# Patient Record
Sex: Male | Born: 1950 | Race: Black or African American | Hispanic: No | Marital: Married | Smoking: Current every day smoker
Health system: Southern US, Community
[De-identification: ages and names within clinical notes are randomized; demographics above are authoritative.]

## PROBLEM LIST (undated history)

## (undated) DIAGNOSIS — I429 Cardiomyopathy, unspecified: Secondary | ICD-10-CM

## (undated) DIAGNOSIS — I2510 Atherosclerotic heart disease of native coronary artery without angina pectoris: Secondary | ICD-10-CM

## (undated) HISTORY — DX: Atherosclerotic heart disease of native coronary artery without angina pectoris: I25.10

## (undated) HISTORY — DX: Cardiomyopathy, unspecified: I42.9

---

## 1999-08-14 ENCOUNTER — Emergency Department (HOSPITAL_COMMUNITY): Admission: EM | Admit: 1999-08-14 | Discharge: 1999-08-14 | Payer: Self-pay | Admitting: Emergency Medicine

## 2004-06-10 ENCOUNTER — Emergency Department (HOSPITAL_COMMUNITY): Admission: EM | Admit: 2004-06-10 | Discharge: 2004-06-10 | Payer: Self-pay | Admitting: Emergency Medicine

## 2004-06-12 ENCOUNTER — Ambulatory Visit: Payer: Self-pay | Admitting: Internal Medicine

## 2004-09-21 ENCOUNTER — Emergency Department (HOSPITAL_COMMUNITY): Admission: EM | Admit: 2004-09-21 | Discharge: 2004-09-21 | Payer: Self-pay | Admitting: *Deleted

## 2006-02-23 ENCOUNTER — Emergency Department (HOSPITAL_COMMUNITY): Admission: EM | Admit: 2006-02-23 | Discharge: 2006-02-23 | Payer: Self-pay | Admitting: Emergency Medicine

## 2006-04-07 ENCOUNTER — Emergency Department (HOSPITAL_COMMUNITY): Admission: EM | Admit: 2006-04-07 | Discharge: 2006-04-07 | Payer: Self-pay | Admitting: Emergency Medicine

## 2006-05-15 ENCOUNTER — Emergency Department (HOSPITAL_COMMUNITY): Admission: EM | Admit: 2006-05-15 | Discharge: 2006-05-15 | Payer: Self-pay | Admitting: Emergency Medicine

## 2006-05-18 ENCOUNTER — Emergency Department (HOSPITAL_COMMUNITY): Admission: EM | Admit: 2006-05-18 | Discharge: 2006-05-18 | Payer: Self-pay | Admitting: Family Medicine

## 2006-05-23 ENCOUNTER — Emergency Department (HOSPITAL_COMMUNITY): Admission: EM | Admit: 2006-05-23 | Discharge: 2006-05-23 | Payer: Self-pay | Admitting: Emergency Medicine

## 2010-06-21 ENCOUNTER — Inpatient Hospital Stay (INDEPENDENT_AMBULATORY_CARE_PROVIDER_SITE_OTHER)
Admission: RE | Admit: 2010-06-21 | Discharge: 2010-06-21 | Disposition: A | Discharge status: Home / Self Care | Source: Ambulatory Visit | Attending: Emergency Medicine | Admitting: Emergency Medicine

## 2010-06-21 DIAGNOSIS — L03119 Cellulitis of unspecified part of limb: Secondary | ICD-10-CM

## 2010-06-25 LAB — CULTURE, ROUTINE-ABSCESS: Gram Stain: NONE SEEN

## 2011-01-13 ENCOUNTER — Emergency Department (HOSPITAL_COMMUNITY): Payer: BC Managed Care – PPO

## 2011-01-13 ENCOUNTER — Observation Stay (HOSPITAL_COMMUNITY)
Admission: EM | Admit: 2011-01-13 | Discharge: 2011-01-14 | Disposition: A | Discharge status: Home / Self Care | Payer: BC Managed Care – PPO | Attending: Internal Medicine | Admitting: Internal Medicine

## 2011-01-13 DIAGNOSIS — R030 Elevated blood-pressure reading, without diagnosis of hypertension: Secondary | ICD-10-CM | POA: Insufficient documentation

## 2011-01-13 DIAGNOSIS — I428 Other cardiomyopathies: Principal | ICD-10-CM | POA: Insufficient documentation

## 2011-01-13 DIAGNOSIS — J438 Other emphysema: Secondary | ICD-10-CM | POA: Insufficient documentation

## 2011-01-13 DIAGNOSIS — F17200 Nicotine dependence, unspecified, uncomplicated: Secondary | ICD-10-CM | POA: Insufficient documentation

## 2011-01-13 DIAGNOSIS — R0989 Other specified symptoms and signs involving the circulatory and respiratory systems: Secondary | ICD-10-CM | POA: Insufficient documentation

## 2011-01-13 DIAGNOSIS — R9431 Abnormal electrocardiogram [ECG] [EKG]: Secondary | ICD-10-CM | POA: Insufficient documentation

## 2011-01-13 DIAGNOSIS — R0602 Shortness of breath: Secondary | ICD-10-CM | POA: Insufficient documentation

## 2011-01-13 DIAGNOSIS — R0609 Other forms of dyspnea: Secondary | ICD-10-CM | POA: Insufficient documentation

## 2011-01-13 DIAGNOSIS — J310 Chronic rhinitis: Secondary | ICD-10-CM | POA: Insufficient documentation

## 2011-01-14 ENCOUNTER — Emergency Department (HOSPITAL_COMMUNITY): Payer: BC Managed Care – PPO

## 2011-01-14 ENCOUNTER — Observation Stay (HOSPITAL_COMMUNITY): Payer: BC Managed Care – PPO

## 2011-01-14 ENCOUNTER — Observation Stay (HOSPITAL_COMMUNITY)
Admission: EM | Admit: 2011-01-14 | Discharge: 2011-01-16 | Disposition: A | Discharge status: Home / Self Care | Payer: BC Managed Care – PPO | Attending: Interventional Cardiology | Admitting: Interventional Cardiology

## 2011-01-14 DIAGNOSIS — Z01810 Encounter for preprocedural cardiovascular examination: Secondary | ICD-10-CM | POA: Insufficient documentation

## 2011-01-14 DIAGNOSIS — R0602 Shortness of breath: Secondary | ICD-10-CM | POA: Insufficient documentation

## 2011-01-14 DIAGNOSIS — I2510 Atherosclerotic heart disease of native coronary artery without angina pectoris: Principal | ICD-10-CM | POA: Insufficient documentation

## 2011-01-14 DIAGNOSIS — R0989 Other specified symptoms and signs involving the circulatory and respiratory systems: Secondary | ICD-10-CM | POA: Insufficient documentation

## 2011-01-14 DIAGNOSIS — I472 Ventricular tachycardia, unspecified: Secondary | ICD-10-CM | POA: Insufficient documentation

## 2011-01-14 DIAGNOSIS — R0609 Other forms of dyspnea: Secondary | ICD-10-CM | POA: Insufficient documentation

## 2011-01-14 DIAGNOSIS — I4729 Other ventricular tachycardia: Secondary | ICD-10-CM | POA: Insufficient documentation

## 2011-01-14 DIAGNOSIS — I428 Other cardiomyopathies: Secondary | ICD-10-CM | POA: Insufficient documentation

## 2011-01-14 DIAGNOSIS — I200 Unstable angina: Secondary | ICD-10-CM | POA: Insufficient documentation

## 2011-01-14 DIAGNOSIS — I359 Nonrheumatic aortic valve disorder, unspecified: Secondary | ICD-10-CM

## 2011-01-14 DIAGNOSIS — F17200 Nicotine dependence, unspecified, uncomplicated: Secondary | ICD-10-CM | POA: Insufficient documentation

## 2011-01-14 DIAGNOSIS — Z01812 Encounter for preprocedural laboratory examination: Secondary | ICD-10-CM | POA: Insufficient documentation

## 2011-01-14 DIAGNOSIS — R079 Chest pain, unspecified: Secondary | ICD-10-CM

## 2011-01-14 LAB — CBC
HCT: 42.8 % (ref 39.0–52.0)
HCT: 43.6 % (ref 39.0–52.0)
Hemoglobin: 15 g/dL (ref 13.0–17.0)
MCH: 33.6 pg (ref 26.0–34.0)
MCHC: 34.9 g/dL (ref 30.0–36.0)
MCHC: 35 g/dL (ref 30.0–36.0)
MCV: 95.7 fL (ref 78.0–100.0)
MCV: 97.5 fL (ref 78.0–100.0)
Platelets: 155 10*3/uL (ref 150–400)
RDW: 12.7 % (ref 11.5–15.5)
WBC: 5.9 10*3/uL (ref 4.0–10.5)

## 2011-01-14 LAB — POCT I-STAT, CHEM 8
Calcium, Ion: 1.24 mmol/L (ref 1.12–1.32)
Chloride: 105 mEq/L (ref 96–112)
Potassium: 3.6 mEq/L (ref 3.5–5.1)

## 2011-01-14 LAB — CARDIAC PANEL(CRET KIN+CKTOT+MB+TROPI)
CK, MB: 2.6 ng/mL (ref 0.3–4.0)
Relative Index: 1.8 (ref 0.0–2.5)
Relative Index: 2 (ref 0.0–2.5)
Total CK: 133 U/L (ref 7–232)
Troponin I: <0.3 ng/mL (ref <0.30)

## 2011-01-14 LAB — DIFFERENTIAL
Basophils Absolute: 0 10*3/uL (ref 0.0–0.1)
Eosinophils Relative: 2 % (ref 0–5)
Eosinophils Relative: 3 % (ref 0–5)
Lymphs Abs: 1.9 10*3/uL (ref 0.7–4.0)
Monocytes Absolute: 0.4 10*3/uL (ref 0.1–1.0)
Monocytes Absolute: 0.5 10*3/uL (ref 0.1–1.0)
Monocytes Relative: 8 % (ref 3–12)
Neutrophils Relative %: 58 % (ref 43–77)

## 2011-01-14 LAB — COMPREHENSIVE METABOLIC PANEL
AST: 20 U/L (ref 0–37)
Albumin: 3.9 g/dL (ref 3.5–5.2)
Alkaline Phosphatase: 84 U/L (ref 39–117)
BUN: 13 mg/dL (ref 6–23)
Calcium: 9.8 mg/dL (ref 8.4–10.5)
Chloride: 105 mEq/L (ref 96–112)
Creatinine, Ser: 0.93 mg/dL (ref 0.50–1.35)
Glucose, Bld: 104 mg/dL — ABNORMAL HIGH (ref 70–99)
Sodium: 142 mEq/L (ref 135–145)
Total Bilirubin: 0.5 mg/dL (ref 0.3–1.2)
Total Protein: 7.1 g/dL (ref 6.0–8.3)

## 2011-01-14 LAB — LIPID PANEL
LDL Cholesterol: 97 mg/dL (ref 0–99)
Total CHOL/HDL Ratio: 2.2 RATIO
Triglycerides: 63 mg/dL (ref <150)

## 2011-01-14 LAB — CK TOTAL AND CKMB (NOT AT ARMC): Total CK: 179 U/L (ref 7–232)
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
## 2011-01-15 LAB — BASIC METABOLIC PANEL
BUN: 15 mg/dL (ref 6–23)
Chloride: 106 mEq/L (ref 96–112)
GFR calc Af Amer: >90 mL/min (ref >90)
Glucose, Bld: 95 mg/dL (ref 70–99)
Potassium: 4 mEq/L (ref 3.5–5.1)

## 2011-01-15 LAB — TROPONIN I: Troponin I: <0.3 ng/mL (ref <0.30)

## 2011-01-15 LAB — POCT I-STAT TROPONIN I

## 2011-01-15 LAB — PROTIME-INR: Prothrombin Time: 13.3 seconds (ref 11.6–15.2)

## 2011-01-15 LAB — CK TOTAL AND CKMB (NOT AT ARMC): CK, MB: 2.4 ng/mL (ref 0.3–4.0)

## 2011-01-15 LAB — CARDIAC PANEL(CRET KIN+CKTOT+MB+TROPI)
CK, MB: 2.4 ng/mL (ref 0.3–4.0)
Relative Index: 2.1 (ref 0.0–2.5)
Total CK: 115 U/L (ref 7–232)
Troponin I: <0.3 ng/mL (ref <0.30)
Troponin I: <0.3 ng/mL (ref <0.30)

## 2011-01-15 LAB — APTT: aPTT: 32 seconds (ref 24–37)

## 2011-01-16 LAB — BASIC METABOLIC PANEL
CO2: 25 mEq/L (ref 19–32)
Calcium: 9.1 mg/dL (ref 8.4–10.5)
GFR calc Af Amer: >90 mL/min (ref >90)
GFR calc non Af Amer: >90 mL/min (ref >90)
Sodium: 139 mEq/L (ref 135–145)

## 2011-01-16 LAB — CBC
MCH: 33.2 pg (ref 26.0–34.0)
Platelets: 146 10*3/uL — ABNORMAL LOW (ref 150–400)
RBC: 4.1 MIL/uL — ABNORMAL LOW (ref 4.22–5.81)
RDW: 12.6 % (ref 11.5–15.5)

## 2011-01-28 NOTE — H&P (Signed)
Steven Bernard, [name]:  [id number]  MEDICAL RECORD NO.:  [id number]  LOCATION:                                 FACILITY:  PHYSICIAN:  Harlon Flor, MD   DATE OF BIRTH:  Aug 23, 1950  DATE OF ADMISSION:  01/14/2011 DATE OF DISCHARGE:                             HISTORY & PHYSICAL   CARDIOLOGIST:  Corky Crafts, MD  PRIMARY CARE:  There is none.  CHIEF COMPLAINT:  Chest pain.  HISTORY OF PRESENT ILLNESS:  Steven Bernard is a 60 year old gentleman who was admitted yesterday to the Hospitalist Service for shortness of breath, which was felt to be due to sinus congestion.  He was noted to have anterior Q-waves on his EKG and underwent an echocardiogram that showed an EF of 45%-50% with apical and anterolateral akinesis.  It was suspected that he had had a silent MI and because he had not had any chest pain, he was discharged home after being seen by Dr. [name] with plans for an outpatient left heart catheterization.  After lying in bed tonight, he developed 20 minutes of chest discomfort that spontaneously resolved.  This occurred at rest.  He has also had continued episodes of exertional shortness of breath and shortness of breath at rest.  Because of this, he came back to the ER tonight.  PAST MEDICAL HISTORY: 1. Suspected previous acute MI, silent. 2. Cardiomyopathy with mild LV dysfunction, recently started on an ACE     inhibitor.  PAST SURGICAL HISTORY:  None.  ALLERGIES:  No known drug allergies.  MEDICATIONS: 1. Aspirin 81 mg daily. 2. Lisinopril 10 mg daily. 3. Flonase 2 sprays daily. 4. Albuterol p.r.n.  SOCIAL HISTORY:  The patient smokes 4-6 cigarettes per day, but planning to quit.  He does not drink currently, and he works in maintenance.  FAMILY HISTORY:  His father had an MI in his 37s, and his brother had lung disease.  REVIEW OF SYSTEMS:  A 14-point review of systems is negative except as stated in the HPI.  PHYSICAL  EXAMINATION:  VITAL SIGNS:  Blood pressure 128/80, pulse 55, respirations 16, temperature 97.6. GENERAL:  No acute distress. HEENT:  Extraocular movements intact.  Oropharynx benign.  Nonicteric sclera. NECK:  Supple. CARDIOVASCULAR:  Regular rate and rhythm without murmurs, rubs, or gallops. LUNGS:  Clear to auscultation bilaterally. ABDOMEN:  Soft, nontender, and nondistended. EXTREMITIES:  There is no clubbing, cyanosis, or edema.  His bilateral femoral pulses are 2+. NEURO:  Grossly afocal.  Moves all extremities well.  He is alert and oriented. SKIN:  No rashes. LYMPH:  No lymphadenopathy.  EKG was reviewed and shows normal sinus rhythm with anterior Q-waves. Chest x-ray from yesterday was reviewed and this was clear without any acute abnormality.  His point-of-care troponin is normal.  His BMP and CBC are reviewed and are normal.  His echocardiogram from yesterday was reviewed and shows a 45%-50% ejection fraction with apical and anterolateral akinesis.  ASSESSMENT AND PLAN: 1. Steven Bernard is a 60 year old gentleman with suspected silent myocardial     infarction in the past who had plans for an outpatient left heart  catheterization who then developed chest pain at rest tonight.  He     is currently chest pain-free, and his enzymes are negative.  We     will increase his aspirin to 325 and follow his cardiac enzymes.     We will not put him on heparin or Lovenox for now unless his     enzymes are abnormal.  We will plan a left heart cath in the     morning to define his anatomy.  If he has recurrent chest pain, we     will consider adding Imdur. 2. Cardiomyopathy:  Mild LV dysfunction, on an ACE inhibitor.  His     heart rate is in the 50s, so we will hold off on starting a beta-     blocker for now. 3. Shortness of breath:  Could be related to his cardiomyopathy, but     appears to be more likely due to postnasal drip.     Harlon Flor, MD     MMB/MEDQ  D:   01/15/2011  T:  01/15/2011  Job:  [id number]  cc:   Corky Crafts, MD  Electronically Signed by Meridee Score MD on 01/28/2011 11:51:26 PM

## 2011-02-09 NOTE — Discharge Summary (Signed)
Steven Bernard, CATTERTON NO.:  [id number]  MEDICAL RECORD NO.:  [id number]  LOCATION:  2502                         FACILITY:  [facility]  PHYSICIAN:  Corky Crafts, MDDATE OF BIRTH:  10/01/1950  DATE OF ADMISSION:  01/15/2011 DATE OF DISCHARGE:  01/16/2011                              DISCHARGE SUMMARY   FINAL DIAGNOSES: 1. Coronary artery disease. 2. Tobacco abuse. 3. Unstable angina.  PROCEDURES PERFORMED:  Cardiac catheterization with bare metal stent placement to the obtuse marginal.  There is attempted angioplasty of the LAD which did not result in significant improvement due to heavy calcification.  HOSPITAL COURSE:  The patient had been in the hospital day earlier.  An echocardiogram revealed abnormal LV function in a pattern that did look like there is an ischemic cause.  We had initially set him up for outpatient catheterization since he was essentially asymptomatic, however, after reaching hospital on the 3rd, he developed some discomfort in his chest.  He was admitted.  He ruled out for MI.  He underwent cardiac catheterization revealing a chronically occluded LAD with right-to-left collaterals.  There is also a focal 90% OM stenosis. He had 1 short run of nonsustained ventricular tachycardia.  He had no chest discomfort while he was in the hospital.  We attempted angioplasty of the LAD, however, it was heavily calcified and it was difficult to advance and remove balloon even after dilatation with a small balloon. Flow was essentially unchanged.  He had collaterals from both the left and the right system.  We subsequently put a bare metal stent into the OM.  He tolerated this well.  I had a long discussion with his family regarding the plan of care. Given his minimal symptoms, he really wanted to avoid bypass surgery if possible.  The other options for his coronary disease were medical therapy.  He does have collateral flow to his distal LAD.   He is essentially asymptomatic.  His LV function is only mildly reduced.  The other option if he develops symptoms was to bring him back and attempt rotational atherectomy followed by stent placement in the LAD.  I think this would be quite a complicated procedure because of the long area of heavy calcification.  It is unclear how big the vessel is as well.  My preference was to attempt medical therapy at this point.  I will follow up with him in the office and we will see how he does.  DISCHARGE MEDICATIONS: 1. Aspirin 325 mg daily. 2. Sublingual nitroglycerin p.r.n. 3. Tylenol p.r.n. 4. Plavix 75 mg daily. 5. Imdur 30 mg daily. 6. Flonase nasal spray. 7. Albuterol inhaler. 8. Lisinopril 10 mg daily. 9. Simvastatin 20 mg daily.  Of note, we did not send him home with a beta-blocker due to bradycardia.  Follow up appointments with Dr. [name] on October 23 at 9:30 a.m.  He will also establish with Dr. Juleen China.  DIET:  Low-sodium, heart-healthy diet.  ACTIVITY:  Increase activity slowly.  No lifting more than 10 pounds for about a week.     Corky Crafts, MD     JSV/MEDQ  D:  01/19/2011  T:  01/19/2011  Job:  [id number]  Electronically Signed by Lance Muss MD on 02/09/2011 01:20:20 PM
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
## 2013-01-08 ENCOUNTER — Encounter: Payer: Self-pay | Admitting: Interventional Cardiology

## 2013-01-11 ENCOUNTER — Encounter: Payer: Self-pay | Admitting: Interventional Cardiology

## 2013-01-11 ENCOUNTER — Encounter: Payer: Self-pay | Admitting: *Deleted

## 2013-01-11 DIAGNOSIS — I429 Cardiomyopathy, unspecified: Secondary | ICD-10-CM | POA: Insufficient documentation

## 2013-01-11 DIAGNOSIS — I2510 Atherosclerotic heart disease of native coronary artery without angina pectoris: Secondary | ICD-10-CM | POA: Insufficient documentation

## 2013-01-17 ENCOUNTER — Ambulatory Visit (INDEPENDENT_AMBULATORY_CARE_PROVIDER_SITE_OTHER): Payer: BC Managed Care – PPO | Admitting: Interventional Cardiology

## 2013-01-17 ENCOUNTER — Encounter: Payer: Self-pay | Admitting: Interventional Cardiology

## 2013-01-17 VITALS — BP 128/84 | HR 62 | Ht 67.0 in | Wt 139.2 lb

## 2013-01-17 DIAGNOSIS — F17200 Nicotine dependence, unspecified, uncomplicated: Secondary | ICD-10-CM

## 2013-01-17 DIAGNOSIS — E782 Mixed hyperlipidemia: Secondary | ICD-10-CM

## 2013-01-17 DIAGNOSIS — I428 Other cardiomyopathies: Secondary | ICD-10-CM

## 2013-01-17 DIAGNOSIS — I429 Cardiomyopathy, unspecified: Secondary | ICD-10-CM

## 2013-01-17 DIAGNOSIS — I2510 Atherosclerotic heart disease of native coronary artery without angina pectoris: Secondary | ICD-10-CM

## 2013-01-17 MED ORDER — ASPIRIN EC 81 MG PO TBEC: 81.0000 mg | DELAYED_RELEASE_TABLET | Freq: Every day | ORAL | Status: AC

## 2013-01-17 NOTE — Patient Instructions (Addendum)
Your physician wants you to follow-up in: 6 months with Dr. [name]. You will receive a reminder letter in the mail two months in advance. If you don't receive a letter, please call our office to schedule the follow-up appointment.  Decrease Aspirin to 81mg  daily.  Your physician recommends that you return for lab work in: today pt has been fasting for over 6 hrs. Okay with with Dr. [name].

## 2013-01-17 NOTE — Progress Notes (Signed)
Patient ID: Steven Bernard, male   DOB: 1950/12/30, 62 y.o.   MRN: [id number]    [address] [place], [place]  [address] Phone: [phone] Fax:  [phone]  Date:  01/17/2013   ID:  Steven Bernard, DOB May 18, 1950, MRN [id number]  PCP:  No primary provider on file.      History of Present Illness: Steven Bernard is a 62 y.o. male who has multivessel CAD. He had a stent to the OM system. His LAD is occluded. CAD/ASCVD:  Denies : Chest pain.  Diaphoresis.  Dizziness.  Dyspnea on exertion.  Fatigue.  Leg edema.  Nitroglycerin.  Orthopnea.  Palpitations.  Paroxysmal nocturnal dyspnea.  Syncope.     Wt Readings from Last 3 Encounters:  01/17/13 139 lb 3.2 oz (63.141 kg)     Past Medical History  Diagnosis Date  . Coronary atherosclerosis of native coronary artery   . Cardiomyopathy     Current Outpatient Prescriptions  Medication Sig Dispense Refill  . aspirin 325 MG tablet Take 325 mg by mouth daily.      . carvedilol (COREG) 3.125 MG tablet Take 3.125 mg by mouth 2 (two) times daily with a meal.      . clopidogrel (PLAVIX) 75 MG tablet Take 75 mg by mouth daily.      . isosorbide mononitrate (IMDUR) 30 MG 24 hr tablet Take 30 mg by mouth daily.      Marland Kitchen lisinopril (PRINIVIL,ZESTRIL) 10 MG tablet Take 10 mg by mouth daily.      . nitroGLYCERIN (NITROSTAT) 0.4 MG SL tablet Place 0.4 mg under the tongue every 5 (five) minutes as needed for chest pain.      . simvastatin (ZOCOR) 40 MG tablet Take 40 mg by mouth every evening.       No current facility-administered medications for this visit.    Allergies:   No Known Allergies  Social History:  The patient  reports that he has quit smoking. He does not have any smokeless tobacco history on file. He reports that he does not drink alcohol or use illicit drugs.   Family History:  The patient's family history is not on file.   ROS:  Please see the history of present illness.  No nausea, vomiting.  No fevers,  chills.  No focal weakness.  No dysuria.  All other systems reviewed and negative.   PHYSICAL EXAM: VS:  BP 128/84  Pulse 62  Ht 5\' 7"  (1.702 m)  Wt 139 lb 3.2 oz (63.141 kg)  BMI 21.8 kg/m2 Well nourished, well developed, in no acute distress HEENT: normal Neck: no JVD, no carotid bruits Cardiac:  normal S1, S2; RRR;  Lungs:  clear to auscultation bilaterally, no wheezing, rhonchi or rales Abd: soft, nontender, no hepatomegaly Ext: no edema Skin: warm and dry Neuro:   no focal abnormalities noted       ASSESSMENT AND PLAN:  Coronary atherosclerosis of native coronary artery  Continue Aspirin Tablet, 325 MG, 1 tablet, Orally, Once a day Continue Plavix Tablet, 75 MG, 1 tablet, Orally, Once a day Increase Simvastatin Tablet, 40 MG, 1 tablet every evening, Orally, Once a day IMAGING: EKG   Harward,Amy 07/11/2012 04:00:54 PM > Mykell Genao,JAY 07/11/2012 04:21:20 PM > NSR, NSST, septal MI   Notes: No angina. Maintaining exercise tolerance despite LAD disease. LDL 101. Complicated LAD occlusion with significant calcification. We discussed intervention with Rotablator but given his lack of symptoms, we both agree that it  is not indicated at this time. If he develops heart failure or significant angina, we could reconsider repeat cardiac cath.  2. Tobacco dependence  Notes: Restarted smoking. He only smokes a few per weeks but needs to quit.  3. Cardiomyopathy  Continue Carvedilol Tablet, 6.25 MG, 1 tablet with food, Orally, Twice a day, 30 day(s), 60, Refills 11 IMAGING: [facility] Echocardiogram (Ordered for 07/11/2012) Notes: beta blocker to help improve LV function. If BP increases, could consider increasing carvedilol. He will call in to tell us his BP readings. Will recheck echo.  4.  HYPERLIPIDEMIA: Check lipids today. Continue simvastatin for now.   Signed, Fredric Mare, MD, [facility] 01/17/2013 3:50 PM

## 2013-01-18 DIAGNOSIS — E782 Mixed hyperlipidemia: Secondary | ICD-10-CM | POA: Insufficient documentation

## 2013-01-18 DIAGNOSIS — Z87891 Personal history of nicotine dependence: Secondary | ICD-10-CM | POA: Insufficient documentation

## 2013-01-18 LAB — LIPID PANEL
Cholesterol: 221 mg/dL — ABNORMAL HIGH (ref 0–200)
Total CHOL/HDL Ratio: 2
Triglycerides: 59 mg/dL (ref 0.0–149.0)

## 2013-01-20 ENCOUNTER — Telehealth: Payer: Self-pay | Admitting: Interventional Cardiology

## 2013-01-20 DIAGNOSIS — Z79899 Other long term (current) drug therapy: Secondary | ICD-10-CM

## 2013-01-20 DIAGNOSIS — E785 Hyperlipidemia, unspecified: Secondary | ICD-10-CM

## 2013-01-20 MED ORDER — ATORVASTATIN CALCIUM 80 MG PO TABS: 80.0000 mg | ORAL_TABLET | Freq: Every day | ORAL | Status: DC

## 2013-01-20 NOTE — Telephone Encounter (Signed)
RF:  CABG, tobacco use, age - LDL goal < 70, non-HDL goal < 100 Meds:  Simvastatin 40 mg qd. LDL stable ~ 100 mg/dL on simvastatin 40 mg qd.  Tolerating this well. Given his elevated LDL, will change to atorvastatin 80 mg qd, and recheck cholesterol / liver in 3 months. Patient notified to d/c simvastatin, start atorvastatin 80 mg qd, and recheck lipid/hepatic panel on 05/06/12.  Rx sent in, Med list updated, and lab set up.

## 2013-01-20 NOTE — Telephone Encounter (Signed)
New Problem  Pt calling for cholesterol results.

## 2013-04-26 ENCOUNTER — Other Ambulatory Visit: Payer: BC Managed Care – PPO

## 2013-05-10 ENCOUNTER — Other Ambulatory Visit (INDEPENDENT_AMBULATORY_CARE_PROVIDER_SITE_OTHER): Payer: BC Managed Care – PPO

## 2013-05-10 DIAGNOSIS — Z79899 Other long term (current) drug therapy: Secondary | ICD-10-CM

## 2013-05-10 DIAGNOSIS — E785 Hyperlipidemia, unspecified: Secondary | ICD-10-CM

## 2013-05-10 LAB — LIPID PANEL
Cholesterol: 172 mg/dL (ref 0–200)
HDL: 81 mg/dL (ref >39.00)
LDL Cholesterol: 81 mg/dL (ref 0–99)
Total CHOL/HDL Ratio: 2
Triglycerides: 52 mg/dL (ref 0.0–149.0)
VLDL: 10.4 mg/dL (ref 0.0–40.0)

## 2013-05-10 LAB — HEPATIC FUNCTION PANEL
ALK PHOS: 51 U/L (ref 39–117)
ALT: 15 U/L (ref 0–53)
AST: 19 U/L (ref 0–37)
Albumin: 3.8 g/dL (ref 3.5–5.2)
BILIRUBIN DIRECT: 0 mg/dL (ref 0.0–0.3)
BILIRUBIN TOTAL: 0.6 mg/dL (ref 0.3–1.2)
Total Protein: 6.7 g/dL (ref 6.0–8.3)

## 2013-05-10 NOTE — Progress Notes (Signed)
Quick Note:  Preliminary report reviewed by triage nurse and sent to MD desk. ______ 

## 2013-05-16 ENCOUNTER — Encounter: Payer: Self-pay | Admitting: Cardiology

## 2013-05-16 ENCOUNTER — Telehealth: Payer: Self-pay | Admitting: Cardiology

## 2013-05-16 DIAGNOSIS — E782 Mixed hyperlipidemia: Secondary | ICD-10-CM

## 2013-05-16 NOTE — Telephone Encounter (Signed)
Message copied by Theda SersSTEGALL, Aslan Himes H on Tue May 16, 2013 12:01 PM ------      Message from: Corky CraftsVARANASI, JAYADEEP S      Created: Sat May 13, 2013 11:33 PM       Lipids controled.  Continue current meds, ------

## 2013-05-16 NOTE — Telephone Encounter (Signed)
1 year follow up lab scheduled and ordered.

## 2013-05-24 ENCOUNTER — Telehealth: Payer: Self-pay | Admitting: Interventional Cardiology

## 2013-05-24 NOTE — Telephone Encounter (Signed)
To Dr. [name], please advise.  

## 2013-05-24 NOTE — Telephone Encounter (Signed)
New message    Want the name of a primary care doctor Dr Seth BakeV recommends

## 2013-05-29 NOTE — Telephone Encounter (Signed)
Pt.notified

## 2013-05-29 NOTE — Telephone Encounter (Signed)
New Problem:  Pt is calling to hear lab results.

## 2013-05-31 NOTE — Telephone Encounter (Signed)
Please find out which [facility] office would be closest to him and we can choose from there.  I would like for him to go to someone on Epic.

## 2013-06-01 NOTE — Telephone Encounter (Signed)
FYi to Dr. [name]. 

## 2013-06-01 NOTE — Telephone Encounter (Signed)
lmtrc

## 2013-06-01 NOTE — Telephone Encounter (Signed)
Spoke with pt and he already has an appt [facility]

## 2013-06-19 ENCOUNTER — Ambulatory Visit (INDEPENDENT_AMBULATORY_CARE_PROVIDER_SITE_OTHER): Payer: BC Managed Care – PPO | Admitting: Physician Assistant

## 2013-06-19 ENCOUNTER — Encounter: Payer: Self-pay | Admitting: Physician Assistant

## 2013-06-19 VITALS — BP 120/78 | HR 72 | Temp 97.8°F | Resp 16 | Wt 139.1 lb

## 2013-06-19 DIAGNOSIS — Z131 Encounter for screening for diabetes mellitus: Secondary | ICD-10-CM

## 2013-06-19 DIAGNOSIS — Z1211 Encounter for screening for malignant neoplasm of colon: Secondary | ICD-10-CM

## 2013-06-19 DIAGNOSIS — I2510 Atherosclerotic heart disease of native coronary artery without angina pectoris: Secondary | ICD-10-CM

## 2013-06-19 DIAGNOSIS — Z0000 Encounter for general adult medical examination without abnormal findings: Secondary | ICD-10-CM

## 2013-06-19 DIAGNOSIS — E785 Hyperlipidemia, unspecified: Secondary | ICD-10-CM

## 2013-06-19 DIAGNOSIS — E782 Mixed hyperlipidemia: Secondary | ICD-10-CM

## 2013-06-19 DIAGNOSIS — I10 Essential (primary) hypertension: Secondary | ICD-10-CM | POA: Insufficient documentation

## 2013-06-19 LAB — GLUCOSE, POCT (MANUAL RESULT ENTRY): POC Glucose: 109 mg/dl — AB (ref 70–99)

## 2013-06-19 NOTE — Assessment & Plan Note (Signed)
Well controlled, last labs  Lab Results  Component Value Date   CHOL 172 05/10/2013   HDL 81.00 05/10/2013   LDLCALC 81 05/10/2013   LDLDIRECT 103.1 01/17/2013   TRIG 52.0 05/10/2013   CHOLHDL 2 05/10/2013    Atorvastatin 80 mg once daily

## 2013-06-19 NOTE — Progress Notes (Signed)
Patient ID: Steven Bernard is a 63 y.o. male DOB: May 06, 2050 MRN: [id number]     HPI:  Patient is a 63 year old male who presents to the office to establish care. Is retired from [employer], currently works in a maintenance position. Patient reports history of Coronary Atherosclerosis, follows with cardiologist Dr. Eldridge DaceVaranasi, had stent placement in 2012. Treated with daily ASA 81 mg, Plavix 75 mg daily. History of HTN treated with Coreg 3.125 mg twice daily and Lisinopril 10 mg daily, Hyperlipidemia treated with Lipitor 80 mg daily. Angina treated with Imdur 30 mg daily and Nitroglycerin 0.4 mg as needed. Reports has never had to use the nitroglycerin since he has had it. Denies chest pain/palpitations, SOB, cough, extremity swelling, N/V/F, change in bowel/bladder habits, blood in urine or stool, nocturia, visual change/disturbances, numbness, tingling or weakness. Does not require refills today.   Influenza: does not take Tetanus: uncertain Eye Dr. Has appointment scheduled today Dentist: appointment in May 2015, for dentures Colonoscopy: never  ROS: As stated in HPI. All other systems negative  Past Medical History  Diagnosis Date  . Coronary atherosclerosis of native coronary artery   . Cardiomyopathy    Family History  Problem Relation Age of Onset  . Breast cancer Mother   . Cancer Mother   . Heart attack Father   . Stroke Father    History   Social History  . Marital Status: Single    Spouse Name: N/A    Number of Children: N/A  . Years of Education: N/A   Social History Main Topics  . Smoking status: Former Smoker -- 1.00 packs/day for 45 years  . Smokeless tobacco: None  . Alcohol Use: Yes  . Drug Use: No  . Sexual Activity: None   Other Topics Concern  . None   Social History Narrative  . None   History reviewed. No pertinent past surgical history. Current Outpatient Prescriptions on File Prior to Visit  Medication Sig Dispense Refill  . aspirin EC 81 MG  tablet Take 1 tablet (81 mg total) by mouth daily.  90 tablet  3  . atorvastatin (LIPITOR) 80 MG tablet Take 1 tablet (80 mg total) by mouth daily.  30 tablet  6  . carvedilol (COREG) 3.125 MG tablet Take 3.125 mg by mouth 2 (two) times daily with a meal.      . clopidogrel (PLAVIX) 75 MG tablet Take 75 mg by mouth daily.      . isosorbide mononitrate (IMDUR) 30 MG 24 hr tablet Take 30 mg by mouth daily.      Marland Kitchen. lisinopril (PRINIVIL,ZESTRIL) 10 MG tablet Take 10 mg by mouth daily.      . nitroGLYCERIN (NITROSTAT) 0.4 MG SL tablet Place 0.4 mg under the tongue every 5 (five) minutes as needed for chest pain.       No current facility-administered medications on file prior to visit.   No Known Allergies  PE:  Filed Vitals:   06/19/13 0835  BP: 120/78  Pulse: 72  Temp: 97.8 F (36.6 C)  Resp: 16    CONSTITUTIONAL: Well developed, well nourished, pleasant, appears stated age, in NAD HEENT: normocephalic, atraumatic, bilateral ext/int canals normal. Bilateral TM's without injections, bulging, erythema. Nose normal, uvula midline, oropharynx clear and moist. EYES: PERRLA, bilateral EOM and conjunctiva normal NECK: FROM, supple, without thyromegaly or mass, no carotid bruits noted CARDIO: RRR, normal S1 and S2, distal pulses intact. PULM/CHEST CTA bilateral, no wheezes, rales or rhonchi. Non tender.  ABD: appearance normal, soft, nontender. Normal bowel sounds x 4 quadrants, nonpalpable liver, spleen, kidney. GU: deferred.  MUSC: FROM U/LE bilateral, FROM thoracic and lumbar spine, no midline tenderness LYMPH: no cervical, supraclavicular adenopathy NEURO: alert and oriented x 3, no cranial nerve deficit, motor strength and coordination NL. DTR'S intact. Negative romberg. Gait normal. SKIN: warm, dry, no rash or lesions noted. PSYCH: Mood and affect normal, speech normal.   Lab Results  Component Value Date   WBC 5.3 01/16/2011   HGB 13.6 01/16/2011   HCT 39.4 01/16/2011   PLT 146*  01/16/2011   GLUCOSE 94 01/16/2011   CHOL 172 05/10/2013   TRIG 52.0 05/10/2013   HDL 81.00 05/10/2013   LDLDIRECT 103.1 01/17/2013   LDLCALC 81 05/10/2013   ALT 15 05/10/2013   AST 19 05/10/2013   NA 139 01/16/2011   K 4.2 01/16/2011   CL 107 01/16/2011   CREATININE 0.80 01/16/2011   BUN 17 01/16/2011   CO2 25 01/16/2011   INR 0.99 01/15/2011   HGBA1C 5.5 01/14/2011     ASSESSMENT and PLAN   CPX/v70.0 - Patient has been counseled on age-appropriate routine health concerns for screening and prevention. These are reviewed and up-to-date. Immunizations are up-to-date or declined. Labs and ECG reviewed.  HM: Referral for colonoscopy  HTN Continue on current medications Carvedilol 3.125 mg bid with meals Lisinopril 10 mg once daily  Coronary atherosclerosis Plavix 75 mg once daily Keep regularly scheduled appointments with cardiology as recommended  Hyperlipidemia: Atorvastatin 80 mg once daily  Angina: Isosorbide Mononitrate 30 mg once daily Nitroglycerin 0.4 mg under tongue as needed for chest pain

## 2013-06-19 NOTE — Patient Instructions (Signed)
It was great meeting you today Steven Bernard!   Labs have been ordered for you today.   Please continue taking all medications as prescribed.     Hypertension Hypertension is another name for high blood pressure. High blood pressure may mean that your heart needs to work harder to pump blood. Blood pressure consists of two numbers, which includes a higher number over a lower number (example: 110/72). HOME CARE   Make lifestyle changes as told by your doctor. This may include weight loss and exercise.  Take your blood pressure medicine every day.  Limit how much salt you use.  Stop smoking if you smoke.  Do not use drugs.  Talk to your doctor if you are using decongestants or birth control pills. These medicines might make blood pressure higher.  Females should not drink more than 1 alcoholic drink per day. Males should not drink more than 2 alcoholic drinks per day.  See your doctor as told. GET HELP RIGHT AWAY IF:   You have a blood pressure reading with a top number of 180 or higher.  You get a very bad headache.  You get blurred or changing vision.  You feel confused.  You feel weak, numb, or faint.  You get chest or belly (abdominal) pain.  You throw up (vomit).  You cannot breathe very well. MAKE SURE YOU:   Understand these instructions.  Will watch your condition.  Will get help right away if you are not doing well or get worse. Document Released: 09/16/2007 Document Revised: 06/22/2011 Document Reviewed: 09/16/2007 [facility] Patient Information 2014 [place], [place].   Health Maintenance, Males A healthy lifestyle and preventative care can promote health and wellness.  Maintain regular health, dental, and eye exams.  Eat a healthy diet. Foods like vegetables, fruits, whole grains, low-fat dairy products, and lean protein foods contain the nutrients you need and are low in calories. Decrease your intake of foods high in solid fats, added sugars, and salt.  Get information about a proper diet from your health care provider, if necessary.  Regular physical exercise is one of the most important things you can do for your health. Most adults should get at least 150 minutes of moderate-intensity exercise (any activity that increases your heart rate and causes you to sweat) each week. In addition, most adults need muscle-strengthening exercises on 2 or more days a week.   Maintain a healthy weight. The body mass index (BMI) is a screening tool to identify possible weight problems. It provides an estimate of body fat based on height and weight. Your health care provider can find your BMI and can help you achieve or maintain a healthy weight. For males 20 years and older:  A BMI below 18.5 is considered underweight.  A BMI of 18.5 to 24.9 is normal.  A BMI of 25 to 29.9 is considered overweight.  A BMI of 30 and above is considered obese.  Maintain normal blood lipids and cholesterol by exercising and minimizing your intake of saturated fat. Eat a balanced diet with plenty of fruits and vegetables. Blood tests for lipids and cholesterol should begin at age 35 and be repeated every 5 years. If your lipid or cholesterol levels are high, you are over 50, or you are at high risk for heart disease, you may need your cholesterol levels checked more frequently.Ongoing high lipid and cholesterol levels should be treated with medicines, if diet and exercise are not working.  If you smoke, find out from your  health care provider how to quit. If you do not use tobacco, do not start.  Lung cancer screening is recommended for adults aged 64 80 years who are at high risk for developing lung cancer because of a history of smoking. A yearly low-dose CT scan of the lungs is recommended for people who have at least a 30-pack-year history of smoking and are a current smoker or have quit within the past 15 years. A pack year of smoking is smoking an average of 1 pack of  cigarettes a day for 1 year (for example, a 30-pack-year history of smoking could mean smoking 1 pack a day for 30 years or 2 packs a day for 15 years). Yearly screening should continue until the smoker has stopped smoking for at least 15 years. Yearly screening should be stopped for people who develop a health problem that would prevent them from having lung cancer treatment.  If you choose to drink alcohol, do not have more than 2 drinks per day. One drink is considered to be 12 oz (360 mL) of beer, 5 oz (150 mL) of wine, or 1.5 oz (45 mL) of liquor.  Avoid use of street drugs. Do not share needles with anyone. Ask for help if you need support or instructions about stopping the use of drugs.  High blood pressure causes heart disease and increases the risk of stroke. Blood pressure should be checked at least every 1 2 years. Ongoing high blood pressure should be treated with medicines if weight loss and exercise are not effective.  If you are 5345 64 years old, ask your health care provider if you should take aspirin to prevent heart disease.  Diabetes screening involves taking a blood sample to check your fasting blood sugar level. This should be done once every 3 years after age 64, if you are at a normal weight and without risk factors for diabetes. Testing should be considered at a younger age or be carried out more frequently if you are overweight and have at least 1 risk factor for diabetes.  Colorectal cancer can be detected and often prevented. Most routine colorectal cancer screening begins at the age of 64 and continues through age 64. However, your health care provider may recommend screening at an earlier age if you have risk factors for colon cancer. On a yearly basis, your health care provider may provide home test kits to check for hidden blood in the stool. A small camera at the end of a tube may be used to directly examine the colon (sigmoidoscopy or colonoscopy) to detect the earliest  forms of colorectal cancer. Talk to your health care provider about this at age 64, when routine screening begins. A direct exam of the colon should be repeated every 5 10 years through age 64, unless early forms of pre-cancerous polyps or small growths are found.  People who are at an increased risk for hepatitis B should be screened for this virus. You are considered at high risk for hepatitis B if:  You were born in a country where hepatitis B occurs often. Talk with your health care provider about which countries are considered high-risk.  Your parents were born in a high-risk country and you have not received a shot to protect against hepatitis B (hepatitis B vaccine).  You have HIV or AIDS.  You use needles to inject street drugs.  You live with, or have sex with, someone who has hepatitis B.  You are a man who has  sex with other men (MSM).  You get hemodialysis treatment.  You take certain medicines for conditions like cancer, organ transplantation, and autoimmune conditions.  Hepatitis C blood testing is recommended for all people born from [birth year] through 1966 and any individual with known risk factors for hepatitis C.  Healthy men should no longer receive prostate-specific antigen (PSA) blood tests as part of routine cancer screening. Talk to your health care provider about prostate cancer screening.  Testicular cancer screening is not recommended for adolescents or adult males who have no symptoms. Screening includes self-exam, a health care provider exam, and other screening tests. Consult with your health care provider about any symptoms you have or any concerns you have about testicular cancer.  Practice safe sex. Use condoms and avoid high-risk sexual practices to reduce the spread of sexually transmitted infections (STIs).  Use sunscreen. Apply sunscreen liberally and repeatedly throughout the day. You should seek shade when your shadow is shorter than you. Protect yourself  by wearing long sleeves, pants, a wide-brimmed hat, and sunglasses year round, whenever you are outdoors.  Tell your health care provider of new moles or changes in moles, especially if there is a change in shape or color. Also tell your provider if a mole is larger than the size of a pencil eraser.  A one-time screening for abdominal aortic aneurysm (AAA) and surgical repair of large AAAs by ultrasound is recommended for men aged 36 75 years who are current or former smokers.  Stay current with your vaccines (immunizations). Document Released: 09/26/2007 Document Revised: 01/18/2013 Document Reviewed: 08/25/2010 [facility] Patient Information 2014 [place], [place].

## 2013-06-19 NOTE — Progress Notes (Signed)
Pre visit review using our clinic review tool, if applicable. No additional management support is needed unless otherwise documented below in the visit note. 

## 2013-06-19 NOTE — Assessment & Plan Note (Signed)
Plavix 75 mg once daily ASA 81 mg daily

## 2013-06-19 NOTE — Assessment & Plan Note (Signed)
Continue on current medications Carvedilol 3.125 mg bid with meals Lisinopril 10 mg once daily  BP Readings from Last 3 Encounters:  06/19/13 120/78  01/17/13 128/84

## 2013-06-23 ENCOUNTER — Other Ambulatory Visit (INDEPENDENT_AMBULATORY_CARE_PROVIDER_SITE_OTHER): Payer: BC Managed Care – PPO

## 2013-06-23 DIAGNOSIS — Z131 Encounter for screening for diabetes mellitus: Secondary | ICD-10-CM

## 2013-06-23 DIAGNOSIS — Z0000 Encounter for general adult medical examination without abnormal findings: Secondary | ICD-10-CM

## 2013-06-23 DIAGNOSIS — E785 Hyperlipidemia, unspecified: Secondary | ICD-10-CM

## 2013-06-23 DIAGNOSIS — I10 Essential (primary) hypertension: Secondary | ICD-10-CM

## 2013-06-23 DIAGNOSIS — I2510 Atherosclerotic heart disease of native coronary artery without angina pectoris: Secondary | ICD-10-CM

## 2013-06-23 DIAGNOSIS — Z1211 Encounter for screening for malignant neoplasm of colon: Secondary | ICD-10-CM

## 2013-06-23 LAB — CBC WITH DIFFERENTIAL/PLATELET
Basophils Absolute: 0 10*3/uL (ref 0.0–0.1)
Basophils Relative: 0.5 % (ref 0.0–3.0)
EOS ABS: 0.1 10*3/uL (ref 0.0–0.7)
EOS PCT: 1.3 % (ref 0.0–5.0)
HEMATOCRIT: 41.3 % (ref 39.0–52.0)
Hemoglobin: 14.2 g/dL (ref 13.0–17.0)
LYMPHS ABS: 1.7 10*3/uL (ref 0.7–4.0)
Lymphocytes Relative: 37.6 % (ref 12.0–46.0)
MCHC: 34.4 g/dL (ref 30.0–36.0)
MCV: 98.5 fl (ref 78.0–100.0)
MONO ABS: 0.3 10*3/uL (ref 0.1–1.0)
Monocytes Relative: 7.4 % (ref 3.0–12.0)
Neutro Abs: 2.5 10*3/uL (ref 1.4–7.7)
Neutrophils Relative %: 53.2 % (ref 43.0–77.0)
PLATELETS: 162 10*3/uL (ref 150.0–400.0)
RBC: 4.2 Mil/uL — ABNORMAL LOW (ref 4.22–5.81)
RDW: 13.3 % (ref 11.5–14.6)
WBC: 4.6 10*3/uL (ref 4.5–10.5)

## 2013-06-23 LAB — URINALYSIS, ROUTINE W REFLEX MICROSCOPIC
BILIRUBIN URINE: NEGATIVE
HGB URINE DIPSTICK: NEGATIVE
Ketones, ur: NEGATIVE
Leukocytes, UA: NEGATIVE
NITRITE: NEGATIVE
Specific Gravity, Urine: 1.025 (ref 1.000–1.030)
TOTAL PROTEIN, URINE-UPE24: NEGATIVE
Urine Glucose: NEGATIVE
Urobilinogen, UA: 0.2 (ref 0.0–1.0)
pH: 6 (ref 5.0–8.0)

## 2013-06-23 LAB — BASIC METABOLIC PANEL
BUN: 17 mg/dL (ref 6–23)
CHLORIDE: 102 meq/L (ref 96–112)
CO2: 27 mEq/L (ref 19–32)
Calcium: 9.5 mg/dL (ref 8.4–10.5)
Creatinine, Ser: 1.1 mg/dL (ref 0.4–1.5)
GFR: 90.68 mL/min (ref >60.00)
Glucose, Bld: 94 mg/dL (ref 70–99)
POTASSIUM: 4.4 meq/L (ref 3.5–5.1)
Sodium: 137 mEq/L (ref 135–145)

## 2013-06-23 LAB — TSH: TSH: 1.32 u[IU]/mL (ref 0.35–5.50)

## 2013-07-05 ENCOUNTER — Telehealth: Payer: Self-pay

## 2013-07-05 NOTE — Telephone Encounter (Signed)
Steven Bernard with [employer] called stating that pt was recently seen and had a physical form completed, however total cholesterol was missing. She is requesting a call back with that information. LMOVM providing cholesterol of 172.

## 2013-07-21 ENCOUNTER — Encounter: Payer: Self-pay | Admitting: Physician Assistant

## 2013-07-27 ENCOUNTER — Encounter: Payer: Self-pay | Admitting: Interventional Cardiology

## 2013-07-27 ENCOUNTER — Ambulatory Visit (INDEPENDENT_AMBULATORY_CARE_PROVIDER_SITE_OTHER): Payer: BC Managed Care – PPO | Admitting: Interventional Cardiology

## 2013-07-27 VITALS — BP 140/82 | HR 68 | Ht 67.0 in | Wt 141.0 lb

## 2013-07-27 DIAGNOSIS — F17200 Nicotine dependence, unspecified, uncomplicated: Secondary | ICD-10-CM

## 2013-07-27 DIAGNOSIS — I10 Essential (primary) hypertension: Secondary | ICD-10-CM

## 2013-07-27 DIAGNOSIS — Z0189 Encounter for other specified special examinations: Secondary | ICD-10-CM

## 2013-07-27 DIAGNOSIS — E782 Mixed hyperlipidemia: Secondary | ICD-10-CM

## 2013-07-27 DIAGNOSIS — I2510 Atherosclerotic heart disease of native coronary artery without angina pectoris: Secondary | ICD-10-CM

## 2013-07-27 NOTE — Patient Instructions (Addendum)
Your physician has requested that you have a cardiac MRI. Cardiac MRI uses a computer to create images of your heart as its beating, producing both still and moving pictures of your heart and major blood vessels. For further information please visit [email or web address]. Please follow the instruction sheet given to you today for more information.  Your physician recommends that you schedule a follow-up appointment after MRI.  NO CLIMBING SILOS UNTIL AFTER CARDIAC MRI.

## 2013-07-27 NOTE — Progress Notes (Signed)
Patient ID: Steven Bernard Quesinberry, male   DOB: 1950/05/17, 63 y.o.   MRN: [id number] Patient ID: Steven Bernard Bywater, male   DOB: 1950/05/17, 63 y.o.   MRN: [id number]    [address] [place], [place]  [address] Phone: [phone] Fax:  [phone]  Date:  07/27/2013   ID:  Flora LippsDavid Bernard Slee, DOB 1950/05/17, MRN [id number]  PCP:  Baltazar ApoHartman, Nancy, PA-C      History of Present Illness: Steven Bernard Danish is a 63 y.o. male who has multivessel CAD. He had a stent to the OM system. His LAD is occluded. CAD/ASCVD:  Denies : Chest pain.  Diaphoresis.  Dizziness.  Dyspnea on exertion.  Fatigue.  Leg edema.  Nitroglycerin.  Orthopnea.  Palpitations.  Paroxysmal nocturnal dyspnea.  Syncope.   He had an episode of tachycardia after climbing.  It resolved after 10 minutes.  BP outside of the doctor's office has been checked but he does not remember the readings.  Wt Readings from Last 3 Encounters:  07/27/13 141 lb (63.957 kg)  06/19/13 139 lb 1.9 oz (63.104 kg)  01/17/13 139 lb 3.2 oz (63.141 kg)     Past Medical History  Diagnosis Date  . Coronary atherosclerosis of native coronary artery   . Cardiomyopathy     Current Outpatient Prescriptions  Medication Sig Dispense Refill  . aspirin EC 81 MG tablet Take 1 tablet (81 mg total) by mouth daily.  90 tablet  3  . atorvastatin (LIPITOR) 80 MG tablet Take 1 tablet (80 mg total) by mouth daily.  30 tablet  6  . carvedilol (COREG) 3.125 MG tablet Take 3.125 mg by mouth 2 (two) times daily with a meal.      . clopidogrel (PLAVIX) 75 MG tablet Take 75 mg by mouth daily.      . isosorbide mononitrate (IMDUR) 30 MG 24 hr tablet Take 30 mg by mouth daily.      Marland Kitchen. lisinopril (PRINIVIL,ZESTRIL) 10 MG tablet Take 10 mg by mouth daily.      . nitroGLYCERIN (NITROSTAT) 0.4 MG SL tablet Place 0.4 mg under the tongue every 5 (five) minutes as needed for chest pain.       No current facility-administered medications for this visit.    Allergies:   No Known  Allergies  Social History:  The patient  reports that he has quit smoking. He does not have any smokeless tobacco history on file. He reports that he drinks alcohol. He reports that he does not use illicit drugs.   Family History:  The patient's family history includes Breast cancer in his mother; Cancer in his mother; Heart attack in his father; Stroke in his father.   ROS:  Please see the history of present illness.  No nausea, vomiting.  No fevers, chills.  No focal weakness.  No dysuria.  All other systems reviewed and negative.   PHYSICAL EXAM: VS:  BP 140/82  Pulse 68  Ht 5\' 7"  (1.702 m)  Wt 141 lb (63.957 kg)  BMI 22.08 kg/m2 Well nourished, well developed, in no acute distress HEENT: normal Neck: no JVD, no carotid bruits Cardiac:  normal S1, S2; RRR;  Lungs:  clear to auscultation bilaterally, no wheezing, rhonchi or rales Abd: soft, nontender, no hepatomegaly Ext: no edema Skin: warm and dry Neuro:   no focal abnormalities noted       ASSESSMENT AND PLAN:  Coronary atherosclerosis of native coronary artery / hyperlipidemia Decrease Aspirin Tablet, 81 MG,  1 tablet, Orally, Once a day Continue Plavix Tablet, 75 MG, 1 tablet, Orally, Once a day Increase Simvastatin Tablet, 40 MG, 1 tablet every evening, Orally, Once a day IMAGING: EKG   Harward,Amy 07/11/2012 04:00:54 PM > Seirra Kos,JAY 07/11/2012 04:21:20 PM > NSR, NSST, septal MI   Notes: No angina. Maintaining exercise tolerance despite LAD disease. LDL 81 in 1/15. Complicated LAD occlusion with significant calcification. We discussed intervention with Rotablator but given his lack of symptoms, we both agree that it is not indicated at this time. If he develops heart failure or significant angina, we could reconsider repeat cardiac cath.  He had tachycardia-did not sound like angina.  Will check MRI of heart to eval function and viability of the anterior wall and apex given CTO of LAD.  No climbing up silo at work until  cardiac eval is complete.  2. Tobacco dependence  Notes: Restarted smoking. He only smokeD a few per week but recently quit.  3. Cardiomyopathy  Continue Carvedilol Tablet, 6.25 MG, 1 tablet with food, Orally, Twice a day, 30 day(s), 60, Refills 11  Notes: beta blocker to help improve LV function. If BP increases, could consider increasing carvedilol. He will call in to tell us his BP readings.  4.  HYPERLIPIDEMIA:  lipids controlled. Continue simvastatin for now. LDL 84 in 4/13   Signed, Fredric MareJay S. Aceson Labell, MD, [facility] 07/27/2013 4:22 PM

## 2013-07-30 ENCOUNTER — Other Ambulatory Visit: Payer: Self-pay | Admitting: Interventional Cardiology

## 2013-07-31 ENCOUNTER — Other Ambulatory Visit: Payer: Self-pay

## 2013-07-31 MED ORDER — NITROGLYCERIN 0.4 MG SL SUBL: 0.4000 mg | SUBLINGUAL_TABLET | SUBLINGUAL | Status: AC | PRN

## 2013-07-31 MED ORDER — CARVEDILOL 3.125 MG PO TABS: 3.1250 mg | ORAL_TABLET | Freq: Two times a day (BID) | ORAL | Status: AC

## 2013-07-31 MED ORDER — ATORVASTATIN CALCIUM 80 MG PO TABS: 80.0000 mg | ORAL_TABLET | Freq: Every day | ORAL | Status: AC

## 2013-07-31 MED ORDER — LISINOPRIL 10 MG PO TABS: 10.0000 mg | ORAL_TABLET | Freq: Every day | ORAL | Status: AC

## 2013-07-31 MED ORDER — CLOPIDOGREL BISULFATE 75 MG PO TABS: 75.0000 mg | ORAL_TABLET | Freq: Every day | ORAL | Status: AC

## 2013-07-31 MED ORDER — ISOSORBIDE MONONITRATE ER 30 MG PO TB24: 30.0000 mg | ORAL_TABLET | Freq: Every day | ORAL | Status: AC

## 2013-08-03 ENCOUNTER — Encounter: Payer: Self-pay | Admitting: Interventional Cardiology

## 2013-08-25 ENCOUNTER — Ambulatory Visit (HOSPITAL_COMMUNITY)
Admission: RE | Admit: 2013-08-25 | Discharge: 2013-08-25 | Disposition: A | Discharge status: Home / Self Care | Payer: BC Managed Care – PPO | Source: Ambulatory Visit | Attending: Interventional Cardiology | Admitting: Interventional Cardiology

## 2013-08-25 DIAGNOSIS — I2510 Atherosclerotic heart disease of native coronary artery without angina pectoris: Secondary | ICD-10-CM

## 2013-08-25 DIAGNOSIS — Z0189 Encounter for other specified special examinations: Secondary | ICD-10-CM

## 2013-08-25 LAB — CREATININE, SERUM
CREATININE: 0.98 mg/dL (ref 0.50–1.35)
GFR calc Af Amer: >90 mL/min (ref >90)
GFR, EST NON AFRICAN AMERICAN: 86 mL/min — AB (ref >90)

## 2013-08-25 MED ORDER — GADOBENATE DIMEGLUMINE 529 MG/ML IV SOLN
20.0000 mL | Freq: Once | INTRAVENOUS | Status: AC
  Administered 2013-08-25: 20 mL via INTRAVENOUS

## 2013-09-13 ENCOUNTER — Telehealth: Payer: Self-pay | Admitting: Interventional Cardiology

## 2013-09-13 NOTE — Telephone Encounter (Signed)
He can stop aspirin and Plavix for teeth extraction.Marland Kitchen  He should continue his BP meds.    If he feels up to going up the silos, that is ok.

## 2013-09-13 NOTE — Telephone Encounter (Signed)
To Dr. [name], please advise.  

## 2013-09-13 NOTE — Telephone Encounter (Signed)
New message    Pt needs to have all of his upper teeth removed.  His dentist want him to stop all medications for 1 week prior to extractions.  Is this ok?  Is pt still restricted from going up on a silo (30-40 feet in the air)?

## 2013-09-14 NOTE — Telephone Encounter (Signed)
Pt notified. Pt will call back with fax number.

## 2013-09-21 NOTE — Telephone Encounter (Signed)
Spoke with pt and he will call back with fax number

## 2013-09-27 NOTE — Telephone Encounter (Addendum)
Spoke with pt and the fax number is  [phone] attn Dr. Chryl HeckLamber.

## 2013-09-27 NOTE — Telephone Encounter (Signed)
lmtrc

## 2013-09-27 NOTE — Telephone Encounter (Addendum)
Faxed note.

## 2013-10-20 ENCOUNTER — Encounter: Payer: Self-pay | Admitting: Internal Medicine

## 2013-10-20 ENCOUNTER — Ambulatory Visit (INDEPENDENT_AMBULATORY_CARE_PROVIDER_SITE_OTHER): Payer: BC Managed Care – PPO | Admitting: Internal Medicine

## 2013-10-20 VITALS — BP 112/72 | HR 80 | Temp 98.5°F | Ht 67.0 in | Wt 140.0 lb

## 2013-10-20 DIAGNOSIS — R21 Rash and other nonspecific skin eruption: Secondary | ICD-10-CM

## 2013-10-20 MED ORDER — KETOCONAZOLE 2 % EX CREA: 1.0000 "application " | TOPICAL_CREAM | Freq: Every day | CUTANEOUS | Status: AC

## 2013-10-20 NOTE — Progress Notes (Signed)
   Subjective:    Patient ID: Steven Bernard, male    DOB: [birth year], 63 y.o.   MRN: [id number]  HPI  Here with onset patchy light areas to bilat facies as well as left abd for several months, ? Somewhat worse, nontender , no swelling, and sweats very much most days at work.  No tx for prior similar. Pt denies chest pain, increased sob or doe, wheezing, orthopnea, PND, increased LE swelling, palpitations, dizziness or syncope. Past Medical History  Diagnosis Date  . Coronary atherosclerosis of native coronary artery   . Cardiomyopathy    No past surgical history on file.  reports that he has quit smoking. He does not have any smokeless tobacco history on file. He reports that he drinks alcohol. He reports that he does not use illicit drugs. family history includes Breast cancer in his mother; Cancer in his mother; Heart attack in his father; Stroke in his father. No Known Allergies Current Outpatient Prescriptions on File Prior to Visit  Medication Sig Dispense Refill  . aspirin EC 81 MG tablet Take 1 tablet (81 mg total) by mouth daily.  90 tablet  3  . atorvastatin (LIPITOR) 80 MG tablet Take 1 tablet (80 mg total) by mouth daily.  30 tablet  6  . carvedilol (COREG) 3.125 MG tablet Take 1 tablet (3.125 mg total) by mouth 2 (two) times daily with a meal.  60 tablet  6  . clopidogrel (PLAVIX) 75 MG tablet Take 1 tablet (75 mg total) by mouth daily.  30 tablet  6  . isosorbide mononitrate (IMDUR) 30 MG 24 hr tablet Take 1 tablet (30 mg total) by mouth daily.  30 tablet  6  . lisinopril (PRINIVIL,ZESTRIL) 10 MG tablet Take 1 tablet (10 mg total) by mouth daily.  30 tablet  6  . nitroGLYCERIN (NITROSTAT) 0.4 MG SL tablet Place 1 tablet (0.4 mg total) under the tongue every 5 (five) minutes as needed for chest pain.  25 tablet  3   No current facility-administered medications on file prior to visit.   Review of Systems All otherwise neg per pt     Objective:   Physical Exam BP 112/72   Pulse 80  Temp(Src) 98.5 F (36.9 C) (Oral)  Ht 5\' 7"  (1.702 m)  Wt 140 lb (63.504 kg)  BMI 21.92 kg/m2  SpO2 98% VS noted,  Constitutional: Pt appears well-developed, well-nourished.  HENT: Head: NCAT.  Right Ear: External ear normal.  Left Ear: External ear normal.  Eyes: . Pupils are equal, round, and reactive to light. Conjunctivae and EOM are normal Neck: Normal range of motion. Neck supple.  Cardiovascular: Normal rate and regular rhythm.   Pulmonary/Chest: Effort normal and breath sounds normal.  Neurological: Pt is alert. Not confused , motor grossly intact Skin: with patchy light areas to bilat facies, nontender, with similar less numerous to left abdomen as well Psychiatric: Pt behavior is normal. No agitation.      Assessment & Plan:

## 2013-10-20 NOTE — Progress Notes (Signed)
Pre visit review using our clinic review tool, if applicable. No additional management support is needed unless otherwise documented below in the visit note. 

## 2013-10-20 NOTE — Patient Instructions (Signed)
Please take all new medication as prescribed - the cream for the rash  Please continue all other medications as before, and refills have been done if requested.  Please have the pharmacy call with any other refills you may need.  Please keep your appointments with your specialists as you may have planned

## 2013-10-21 NOTE — Assessment & Plan Note (Signed)
C/w tinea like rash, to relatively small areas of bilat facies, and left abd - for ketoconozole cr prn,  to f/u any worsening symptoms or concerns

## 2014-01-18 ENCOUNTER — Telehealth: Payer: Self-pay | Admitting: Interventional Cardiology

## 2014-01-18 DIAGNOSIS — R002 Palpitations: Secondary | ICD-10-CM

## 2014-01-18 NOTE — Telephone Encounter (Signed)
Pt notified. Instructions given. Faxed to [employer].

## 2014-01-18 NOTE — Telephone Encounter (Signed)
New message    Patient calling did not disclose any information will explain when the nurse call,.

## 2014-01-18 NOTE — Telephone Encounter (Signed)
Pt called stating he tried to climb up the silos at work recently and his heart felt like it was going to beat out of his chest. Human Resources at pts office needs a note stating pt should not climb the silos.   Fax # [phone]

## 2014-01-18 NOTE — Telephone Encounter (Signed)
Patient should not climb stairs until further notice.  Please schedule an ETT to assess exercise capacity-not for detection of ischemia.

## 2014-01-19 ENCOUNTER — Telehealth: Payer: Self-pay | Admitting: Interventional Cardiology

## 2014-01-19 NOTE — Telephone Encounter (Signed)
New message      Patient said HR never received the form with his new restrictions listed.  Pt would like to pick up the form Monday.  If ok, you do not have to call him back

## 2014-01-19 NOTE — Telephone Encounter (Signed)
Noted. I will print last telephone note and put up front for patient to pick up.

## 2014-01-29 ENCOUNTER — Ambulatory Visit: Payer: BC Managed Care – PPO | Admitting: Internal Medicine

## 2014-02-09 ENCOUNTER — Telehealth: Payer: Self-pay | Admitting: Interventional Cardiology

## 2014-02-09 NOTE — Telephone Encounter (Signed)
Pt states that according to [facility] [employer] [facility]  the restrictions that he is on runs out on November 8 th . Pt needs to have the restrictions updated every 30 days. Pt needs updated restrictions faxed to [phone] Attn: [employer].

## 2014-02-09 NOTE — Telephone Encounter (Signed)
New problem   Pt stated the restrictions that he is on has to be updated with his job every 30 days. Please fax request to [phone] Attn: [employer]. Please call pt if any questions

## 2014-02-12 NOTE — Telephone Encounter (Signed)
Patient has a GXT scheduled with Norma FredricksonLori Gerhardt, NP on 02/13/14. See 01/18/14 phone note. I will forward the message to Lawson FiscalLori (as FYI) and Dr. Eldridge DaceVaranasi. Restrictions can be updated after his GXT on 02/13/14.

## 2014-02-13 ENCOUNTER — Ambulatory Visit (INDEPENDENT_AMBULATORY_CARE_PROVIDER_SITE_OTHER): Payer: BC Managed Care – PPO | Admitting: Nurse Practitioner

## 2014-02-13 ENCOUNTER — Encounter: Payer: Self-pay | Admitting: Nurse Practitioner

## 2014-02-13 VITALS — BP 163/98 | HR 87

## 2014-02-13 DIAGNOSIS — R002 Palpitations: Secondary | ICD-10-CM | POA: Diagnosis not present

## 2014-02-13 DIAGNOSIS — I2510 Atherosclerotic heart disease of native coronary artery without angina pectoris: Secondary | ICD-10-CM

## 2014-02-13 NOTE — Progress Notes (Signed)
Exercise Treadmill Test  Pre-Exercise Testing Evaluation Rhythm: normal sinus  Rate: 71 bpm     Test  Exercise Tolerance Test Ordering MD: Steven RankJay [name], MD  Interpreting MD: Steven FredricksonLori Gerhardt, NP  Unique Test No: 1  Treadmill:  1  Indication for ETT: palps  Contraindication to ETT: No   Stress Modality: exercise - treadmill  Cardiac Imaging Performed: non   Protocol: standard Bruce - maximal  Max BP:  192/100  Max MPHR (bpm):  157 85% MPR (bpm):  133  MPHR obtained (bpm):  137 % MPHR obtained:  87%  Reached 85% MPHR (min:sec):  3:14 Total Exercise Time (min-sec):  4:00  Workload in METS:  5.8 Borg Scale: 16  Reason ETT Terminated:  patient's desire to stop    ST Segment Analysis At Rest: normal ST segments - no evidence of significant ST depression With Exercise: no evidence of significant ST depression  Other Information Arrhythmia: One 3 beat run noted with rare PVC. Angina during ETT:  absent (0) Quality of ETT:  diagnostic  ETT Interpretation:  borderline (indeterminate) with non-specific ST changes  Comments: Patient presents today for routine GXT. Has known multivessel CAD, prior stent to the OM system. LAD is occluded. GXT to evaluate for exercise capacity NOT ischemia according to telephone note from Dr. Eldridge Bernard. Patient has work restrictions in place. Resting BP is 163/98. No chest pain. Reports still has a sensation of his "heart beating out of his chest" with going up the silo at work - his last attempt was back in September. He has an EF of 41% per MRI back in May of 2015 noted. His Coreg was held for today's study.  Today the patient exercised on the standard Bruce protocol for a total of 4 minutes.  Reduced exercise tolerance.  Adequate blood pressure response.  Clinically negative for chest pain. Test was stopped due to fatigue/patient's desire to stop and achievement of target HR.  EKG with ST depression in recovery. Patient had one 3 beat run of VT - otherwise rare  PVC noted.    Recommendations: Patient is seen subsequently with Dr. Eldridge Bernard to discuss work restrictions. His GXT is felt to be stable. No real symptoms with today's study noted.  See back as directed. Given ok to try again with walking up the silo according to Dr. Eldridge Bernard.  Patient is agreeable to this plan and will call if any problems develop in the interim.   Steven MacadamiaLori C. Gerhardt, RN, ANP-C [facility] [address] [place], [place]  [id number] [phone]

## 2014-02-13 NOTE — Telephone Encounter (Signed)
Reviewed ETT.  Patient did well without arrhythmia.  He is ok to try going up the silo at work.

## 2014-02-14 ENCOUNTER — Encounter: Payer: Self-pay | Admitting: *Deleted

## 2014-02-14 NOTE — Telephone Encounter (Signed)
I spoke with the and his HR person. He does still need to have a note faxed regarding his restrictions at work. I explained to his HR person that it will be tomorrow afternoon before his note is signed. That is ok per HR. Confirmed fax # [phone].

## 2014-02-14 NOTE — Telephone Encounter (Signed)
I left a message for the patient to call and clarify if I still need to fax something to [facility] updating that he has no restrictions.

## 2014-02-16 NOTE — Telephone Encounter (Signed)
I spoke with the patient. Note faxed last night and re-faxed this morning to [phone]. Confirmation received. The patient is aware.

## 2014-02-16 NOTE — Telephone Encounter (Signed)
Follow up    HR did not get the fax to lift restrictions at work.  Please refax note.

## 2014-02-22 ENCOUNTER — Ambulatory Visit (INDEPENDENT_AMBULATORY_CARE_PROVIDER_SITE_OTHER): Payer: BC Managed Care – PPO | Admitting: Internal Medicine

## 2014-02-22 ENCOUNTER — Encounter: Payer: Self-pay | Admitting: Internal Medicine

## 2014-02-22 ENCOUNTER — Ambulatory Visit (INDEPENDENT_AMBULATORY_CARE_PROVIDER_SITE_OTHER): Payer: BC Managed Care – PPO | Admitting: Geriatric Medicine

## 2014-02-22 VITALS — BP 150/84 | HR 76 | Temp 98.3°F | Resp 12 | Ht 67.0 in | Wt 136.0 lb

## 2014-02-22 DIAGNOSIS — Z418 Encounter for other procedures for purposes other than remedying health state: Secondary | ICD-10-CM | POA: Diagnosis not present

## 2014-02-22 DIAGNOSIS — L98499 Non-pressure chronic ulcer of skin of other sites with unspecified severity: Secondary | ICD-10-CM

## 2014-02-22 DIAGNOSIS — Z299 Encounter for prophylactic measures, unspecified: Secondary | ICD-10-CM

## 2014-02-22 DIAGNOSIS — I2510 Atherosclerotic heart disease of native coronary artery without angina pectoris: Secondary | ICD-10-CM

## 2014-02-22 DIAGNOSIS — Z23 Encounter for immunization: Secondary | ICD-10-CM | POA: Diagnosis not present

## 2014-02-22 DIAGNOSIS — I10 Essential (primary) hypertension: Secondary | ICD-10-CM | POA: Diagnosis not present

## 2014-02-22 DIAGNOSIS — E782 Mixed hyperlipidemia: Secondary | ICD-10-CM

## 2014-02-22 DIAGNOSIS — Z720 Tobacco use: Secondary | ICD-10-CM

## 2014-02-22 DIAGNOSIS — F17200 Nicotine dependence, unspecified, uncomplicated: Secondary | ICD-10-CM

## 2014-02-22 NOTE — Progress Notes (Signed)
Pre visit review using our clinic review tool, if applicable. No additional management support is needed unless otherwise documented below in the visit note. 

## 2014-02-22 NOTE — Patient Instructions (Signed)
We have given you the tetanus shot and a flu shot today. We will not check any blood work today.  You can come back in about 1 year for a checkup. We will send you over to the wound center to see if they can help your leg to heal all the way.  Please feel free to call us with any problems or questions.  Health Maintenance A healthy lifestyle and preventative care can promote health and wellness.  Maintain regular health, dental, and eye exams.  Eat a healthy diet. Foods like vegetables, fruits, whole grains, low-fat dairy products, and lean protein foods contain the nutrients you need and are low in calories. Decrease your intake of foods high in solid fats, added sugars, and salt. Get information about a proper diet from your health care provider, if necessary.  Regular physical exercise is one of the most important things you can do for your health. Most adults should get at least 150 minutes of moderate-intensity exercise (any activity that increases your heart rate and causes you to sweat) each week. In addition, most adults need muscle-strengthening exercises on 2 or more days a week.   Maintain a healthy weight. The body mass index (BMI) is a screening tool to identify possible weight problems. It provides an estimate of body fat based on height and weight. Your health care provider can find your BMI and can help you achieve or maintain a healthy weight. For males 20 years and older:  A BMI below 18.5 is considered underweight.  A BMI of 18.5 to 24.9 is normal.  A BMI of 25 to 29.9 is considered overweight.  A BMI of 30 and above is considered obese.  Maintain normal blood lipids and cholesterol by exercising and minimizing your intake of saturated fat. Eat a balanced diet with plenty of fruits and vegetables. Blood tests for lipids and cholesterol should begin at age 64 and be repeated every 5 years. If your lipid or cholesterol levels are high, you are over age 64, or you are at  high risk for heart disease, you may need your cholesterol levels checked more frequently.Ongoing high lipid and cholesterol levels should be treated with medicines if diet and exercise are not working.  If you smoke, find out from your health care provider how to quit. If you do not use tobacco, do not start.  Lung cancer screening is recommended for adults aged 55-80 years who are at high risk for developing lung cancer because of a history of smoking. A yearly low-dose CT scan of the lungs is recommended for people who have at least a 30-pack-year history of smoking and are current smokers or have quit within the past 15 years. A pack year of smoking is smoking an average of 1 pack of cigarettes a day for 1 year (for example, a 30-pack-year history of smoking could mean smoking 1 pack a day for 30 years or 2 packs a day for 15 years). Yearly screening should continue until the smoker has stopped smoking for at least 15 years. Yearly screening should be stopped for people who develop a health problem that would prevent them from having lung cancer treatment.  If you choose to drink alcohol, do not have more than 2 drinks per day. One drink is considered to be 12 oz (360 mL) of beer, 5 oz (150 mL) of wine, or 1.5 oz (45 mL) of liquor.  Avoid the use of street drugs. Do not share needles with anyone. Ask  for help if you need support or instructions about stopping the use of drugs.  High blood pressure causes heart disease and increases the risk of stroke. Blood pressure should be checked at least every 1-2 years. Ongoing high blood pressure should be treated with medicines if weight loss and exercise are not effective.  If you are 77-85 years old, ask your health care provider if you should take aspirin to prevent heart disease.  Diabetes screening involves taking a blood sample to check your fasting blood sugar level. This should be done once every 3 years after age 36 if you are at a normal weight  and without risk factors for diabetes. Testing should be considered at a younger age or be carried out more frequently if you are overweight and have at least 1 risk factor for diabetes.  Colorectal cancer can be detected and often prevented. Most routine colorectal cancer screening begins at the age of 44 and continues through age 47. However, your health care provider may recommend screening at an earlier age if you have risk factors for colon cancer. On a yearly basis, your health care provider may provide home test kits to check for hidden blood in the stool. A small camera at the end of a tube may be used to directly examine the colon (sigmoidoscopy or colonoscopy) to detect the earliest forms of colorectal cancer. Talk to your health care provider about this at age 53 when routine screening begins. A direct exam of the colon should be repeated every 5-10 years through age 11, unless early forms of precancerous polyps or small growths are found.  People who are at an increased risk for hepatitis B should be screened for this virus. You are considered at high risk for hepatitis B if:  You were born in a country where hepatitis B occurs often. Talk with your health care provider about which countries are considered high risk.  Your parents were born in a high-risk country and you have not received a shot to protect against hepatitis B (hepatitis B vaccine).  You have HIV or AIDS.  You use needles to inject street drugs.  You live with, or have sex with, someone who has hepatitis B.  You are a man who has sex with other men (MSM).  You get hemodialysis treatment.  You take certain medicines for conditions like cancer, organ transplantation, and autoimmune conditions.  Hepatitis C blood testing is recommended for all people born from [birth year] through 1966 and any individual with known risk factors for hepatitis C.  Healthy men should no longer receive prostate-specific antigen (PSA) blood tests  as part of routine cancer screening. Talk to your health care provider about prostate cancer screening.  Testicular cancer screening is not recommended for adolescents or adult males who have no symptoms. Screening includes self-exam, a health care provider exam, and other screening tests. Consult with your health care provider about any symptoms you have or any concerns you have about testicular cancer.  Practice safe sex. Use condoms and avoid high-risk sexual practices to reduce the spread of sexually transmitted infections (STIs).  You should be screened for STIs, including gonorrhea and chlamydia if:  You are sexually active and are younger than 24 years.  You are older than 24 years, and your health care provider tells you that you are at risk for this type of infection.  Your sexual activity has changed since you were last screened, and you are at an increased risk for chlamydia or  gonorrhea. Ask your health care provider if you are at risk.  If you are at risk of being infected with HIV, it is recommended that you take a prescription medicine daily to prevent HIV infection. This is called pre-exposure prophylaxis (PrEP). You are considered at risk if:  You are a man who has sex with other men (MSM).  You are a heterosexual man who is sexually active with multiple partners.  You take drugs by injection.  You are sexually active with a partner who has HIV.  Talk with your health care provider about whether you are at high risk of being infected with HIV. If you choose to begin PrEP, you should first be tested for HIV. You should then be tested every 3 months for as long as you are taking PrEP.  Use sunscreen. Apply sunscreen liberally and repeatedly throughout the day. You should seek shade when your shadow is shorter than you. Protect yourself by wearing long sleeves, pants, a wide-brimmed hat, and sunglasses year round whenever you are outdoors.  Tell your health care provider of  new moles or changes in moles, especially if there is a change in shape or color. Also, tell your health care provider if a mole is larger than the size of a pencil eraser.  A one-time screening for abdominal aortic aneurysm (AAA) and surgical repair of large AAAs by ultrasound is recommended for men aged 25-75 years who are current or former smokers.  Stay current with your vaccines (immunizations). Document Released: 09/26/2007 Document Revised: 04/04/2013 Document Reviewed: 08/25/2010 [facility] Patient Information 2015 [place], [place]. This information is not intended to replace advice given to you by your health care provider. Make sure you discuss any questions you have with your health care provider.

## 2014-02-23 NOTE — Assessment & Plan Note (Signed)
BP well controlled on Coreg, lisinopril. Recent basic metabolic panel reviewed and normal.

## 2014-02-23 NOTE — Assessment & Plan Note (Signed)
He continues to take Lipitor 80 mg daily. Reviewed recent lipid panel.

## 2014-02-23 NOTE — Assessment & Plan Note (Signed)
The patient takes aspirin 81, Lipitor, Coreg, Imdur, lisinopril daily. He does have nitroglycerin when necessary at home however he is not used to. He did recent stress test last week which did not show any signs of ischemia.

## 2014-02-23 NOTE — Assessment & Plan Note (Signed)
Patient denies current usage. Encourage continued cessation. Discussed again risks and harms associated with cigarette smoke.

## 2014-02-23 NOTE — Progress Notes (Signed)
   Subjective:    Patient ID: Steven Bernard, male    DOB: July 28, 1950, 63 y.o.   MRN: [id number]  HPI The patient is a 63 year old man who comes in today to establish care. He has past medical history of cardiomyopathy, hypertension, hx tobacco abuse. He had a stress test last week which came back normal. He denies any anginal pains, shortness of breath, abdominal pain, constipation, diarrhea. He denies any joint pain swelling, dizziness falls.  Review of Systems  Constitutional: Negative for fever, activity change, appetite change, fatigue and unexpected weight change.  HENT: Negative.   Respiratory: Negative for cough, chest tightness, shortness of breath and wheezing.   Cardiovascular: Negative for chest pain, palpitations and leg swelling.  Gastrointestinal: Negative for abdominal pain, diarrhea, constipation and abdominal distention.  Genitourinary: Negative.   Musculoskeletal: Negative for myalgias, back pain and arthralgias.  Skin: Negative.   Neurological: Negative for dizziness, weakness, numbness and headaches.      Objective:   Physical Exam  Constitutional: He is oriented to person, place, and time. He appears well-developed and well-nourished. No distress.  HENT:  Head: Normocephalic and atraumatic.  Eyes: EOM are normal.  Neck: Normal range of motion.  Cardiovascular: Normal rate and regular rhythm.   Pulmonary/Chest: Effort normal and breath sounds normal. No respiratory distress. He has no wheezes. He has no rales.  Abdominal: Soft. Bowel sounds are normal. He exhibits no distension. There is no tenderness. There is no rebound.  Musculoskeletal: Normal range of motion.  Neurological: He is alert and oriented to person, place, and time. Coordination normal.  Skin: Skin is warm and dry.   Filed Vitals:   02/22/14 1512  BP: 150/84  Pulse: 76  Temp: 98.3 F (36.8 C)  TempSrc: Oral  Resp: 12  Height: 5\' 7"  (1.702 m)  Weight: 136 lb (61.689 kg)  SpO2: 93%        Assessment & Plan:

## 2014-03-14 ENCOUNTER — Ambulatory Visit: Payer: BC Managed Care – PPO | Admitting: Interventional Cardiology

## 2014-03-15 ENCOUNTER — Other Ambulatory Visit: Payer: Self-pay | Admitting: Internal Medicine

## 2014-03-15 ENCOUNTER — Encounter (HOSPITAL_BASED_OUTPATIENT_CLINIC_OR_DEPARTMENT_OTHER): Payer: BC Managed Care – PPO | Attending: Internal Medicine

## 2014-03-15 DIAGNOSIS — L97929 Non-pressure chronic ulcer of unspecified part of left lower leg with unspecified severity: Secondary | ICD-10-CM | POA: Insufficient documentation

## 2014-03-15 DIAGNOSIS — I87332 Chronic venous hypertension (idiopathic) with ulcer and inflammation of left lower extremity: Secondary | ICD-10-CM | POA: Diagnosis present
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
## 2014-03-22 DIAGNOSIS — I87332 Chronic venous hypertension (idiopathic) with ulcer and inflammation of left lower extremity: Secondary | ICD-10-CM | POA: Diagnosis not present

## 2014-03-22 DIAGNOSIS — L97929 Non-pressure chronic ulcer of unspecified part of left lower leg with unspecified severity: Secondary | ICD-10-CM | POA: Diagnosis not present

## 2014-03-28 DIAGNOSIS — I87332 Chronic venous hypertension (idiopathic) with ulcer and inflammation of left lower extremity: Secondary | ICD-10-CM | POA: Diagnosis not present

## 2014-03-28 DIAGNOSIS — L97929 Non-pressure chronic ulcer of unspecified part of left lower leg with unspecified severity: Secondary | ICD-10-CM | POA: Diagnosis not present

## 2014-04-04 DIAGNOSIS — I87332 Chronic venous hypertension (idiopathic) with ulcer and inflammation of left lower extremity: Secondary | ICD-10-CM | POA: Diagnosis not present

## 2014-04-04 DIAGNOSIS — L97929 Non-pressure chronic ulcer of unspecified part of left lower leg with unspecified severity: Secondary | ICD-10-CM | POA: Diagnosis not present

## 2014-04-11 DIAGNOSIS — L97929 Non-pressure chronic ulcer of unspecified part of left lower leg with unspecified severity: Secondary | ICD-10-CM | POA: Diagnosis not present

## 2014-04-11 DIAGNOSIS — I87332 Chronic venous hypertension (idiopathic) with ulcer and inflammation of left lower extremity: Secondary | ICD-10-CM | POA: Diagnosis not present

## 2014-04-18 ENCOUNTER — Encounter (HOSPITAL_BASED_OUTPATIENT_CLINIC_OR_DEPARTMENT_OTHER): Payer: BC Managed Care – PPO | Attending: General Surgery

## 2014-04-23 ENCOUNTER — Ambulatory Visit (INDEPENDENT_AMBULATORY_CARE_PROVIDER_SITE_OTHER): Payer: BLUE CROSS/BLUE SHIELD | Admitting: Interventional Cardiology

## 2014-04-23 ENCOUNTER — Encounter: Payer: Self-pay | Admitting: Interventional Cardiology

## 2014-04-23 VITALS — BP 130/70 | HR 69 | Ht 67.0 in | Wt 132.0 lb

## 2014-04-23 DIAGNOSIS — I429 Cardiomyopathy, unspecified: Secondary | ICD-10-CM

## 2014-04-23 DIAGNOSIS — I2510 Atherosclerotic heart disease of native coronary artery without angina pectoris: Secondary | ICD-10-CM

## 2014-04-23 DIAGNOSIS — F17200 Nicotine dependence, unspecified, uncomplicated: Secondary | ICD-10-CM

## 2014-04-23 DIAGNOSIS — Z720 Tobacco use: Secondary | ICD-10-CM

## 2014-04-23 DIAGNOSIS — I10 Essential (primary) hypertension: Secondary | ICD-10-CM

## 2014-04-23 DIAGNOSIS — E782 Mixed hyperlipidemia: Secondary | ICD-10-CM

## 2014-04-23 NOTE — Progress Notes (Signed)
Patient ID: Steven Bernard, male   DOB: 27-Jul-1950, 64 y.o.   MRN: [id number]     [address] [place], [place]  [address] Phone: [phone] Fax:  [phone]  Date:  04/23/2014   ID:  Steven Bernard, DOB 27-Jul-1950, MRN [id number]  PCP:  Judie BonusKollar, Elizabeth A, MD      History of Present Illness: Steven JackDavid T Sarti is a 64 y.o. male who has multivessel CAD. He had a stent to the OM system. His LAD is occluded. CAD/ASCVD:  Denies : Chest pain.  Diaphoresis.  Dizziness.  Dyspnea on exertion.  Fatigue.  Leg edema.  Nitroglycerin.  Orthopnea.  Palpitations.  Paroxysmal nocturnal dyspnea.  Syncope.   He had an episode of tachycardia after climbing.  It resolved after 10 minutes.  This prompted a  Treadmill test.   He has worked it out with his employer to not have to climb the silo.    BP outside of the doctor's office has been checked but he does not remember the readings.  Wt Readings from Last 3 Encounters:  04/23/14 132 lb (59.875 kg)  02/22/14 136 lb (61.689 kg)  10/20/13 140 lb (63.504 kg)     Past Medical History  Diagnosis Date  . Coronary atherosclerosis of native coronary artery   . Cardiomyopathy     Current Outpatient Prescriptions  Medication Sig Dispense Refill  . aspirin EC 81 MG tablet Take 1 tablet (81 mg total) by mouth daily. 90 tablet 3  . atorvastatin (LIPITOR) 80 MG tablet Take 1 tablet (80 mg total) by mouth daily. 30 tablet 6  . carvedilol (COREG) 3.125 MG tablet Take 1 tablet (3.125 mg total) by mouth 2 (two) times daily with a meal. 60 tablet 6  . clopidogrel (PLAVIX) 75 MG tablet Take 1 tablet (75 mg total) by mouth daily. 30 tablet 6  . isosorbide mononitrate (IMDUR) 30 MG 24 hr tablet Take 1 tablet (30 mg total) by mouth daily. 30 tablet 6  . ketoconazole (NIZORAL) 2 % cream Apply 1 application topically daily. 30 g 1  . lisinopril (PRINIVIL,ZESTRIL) 10 MG tablet Take 1 tablet (10 mg total) by mouth daily. 30 tablet 6  . nitroGLYCERIN  (NITROSTAT) 0.4 MG SL tablet Place 1 tablet (0.4 mg total) under the tongue every 5 (five) minutes as needed for chest pain. (Patient not taking: Reported on 04/23/2014) 25 tablet 3   No current facility-administered medications for this visit.    Allergies:   No Known Allergies  Social History:  The patient  reports that he has quit smoking. He does not have any smokeless tobacco history on file. He reports that he drinks alcohol. He reports that he does not use illicit drugs.   Family History:  The patient's family history includes Breast cancer in his mother; Cancer in his mother; Heart attack in his father; Stroke in his father.   ROS:  Please see the history of present illness.  No nausea, vomiting.  No fevers, chills.  No focal weakness.  No dysuria.  All other systems reviewed and negative.   PHYSICAL EXAM: VS:  BP 130/70 mmHg  Pulse 69  Ht 5\' 7"  (1.702 m)  Wt 132 lb (59.875 kg)  BMI 20.67 kg/m2 Well nourished, well developed, in no acute distress HEENT: normal Neck: no JVD, no carotid bruits Cardiac:  normal S1, S2; RRR;  Lungs:  clear to auscultation bilaterally, no wheezing, rhonchi or rales Abd: soft, nontender, no hepatomegaly Ext:  no edema Skin: warm and dry Neuro:   no focal abnormalities noted Psych: normal affect  IMAGING: EKG 04/23/14  ECG: NSR, NSST, septal MI, new inferolateral T wave inversion        ASSESSMENT AND PLAN:  Coronary atherosclerosis of native coronary artery / hyperlipidemia Decrease Aspirin Tablet, 81 MG, 1 tablet, Orally, Once a day Continue Plavix Tablet, 75 MG, 1 tablet, Orally, Once a day Increase Simvastatin Tablet, 40 MG, 1 tablet every evening, Orally, Once a day   Notes: No angina. Maintaining exercise tolerance despite LAD disease. LDL 81 in 1/15. Complicated LAD occlusion with significant calcification. We discussed intervention with Rotablator but given his lack of symptoms, we both agree that it is not indicated at this time. If  he develops heart failure or significant angina, we could reconsider repeat cardiac cath.  He had tachycardia-did not sound like angina.    MRI results reviewed.MRI of heart to eval function and viability of the anterior wall and apex showed no viability in the anterior wall and apex, but some viability in the anteroseptal area..  No climbing up silo at work. It is quite stressful for him...given his LV dysfuncton and cardiac ssues, would have him avoid this activity.    Given the lack of symptoms , and the fact that the anterior wall and apex are nonviable , would hold off on revascularization attempt at this time.  ECG changes but no symptoms.  Continue to monitor.  2. Tobacco dependence  Notes: Restarted smoking. He only smokes 3/day.  He will look into OTC smoking cessation aids.  He does not want Chantix.   I recommended either patches or gum. He does need to stop smoking. 3. Cardiomyopathy /HTN Continue Carvedilol Tablet, 6.25 MG, 1 tablet with food, Orally, Twice a day, 30 day(s), 60, Refills 11  Notes: beta blocker to help improve LV function. If BP increases, could consider increasing carvedilol. He will call in to tell us his BP readings.  It is checked weekly at the wound center.  It has been controlled per his report.   4.  HYPERLIPIDEMIA:  lipids controlled. Continue simvastatin for now. LDL 84 in 4/13. Controlled in 1/15. Recheck when fasting given that it has been a year.     Signed, Fredric Mare, MD, [facility] 04/23/2014 4:25 PM

## 2014-04-23 NOTE — Patient Instructions (Signed)
Your physician recommends that you continue on your current medications as directed. Please refer to the Current Medication list given to you today.  Your physician recommends that you return for lab work when fasting on a day that's good for you. (CMET and Lipid Panel)  Your physician wants you to follow-up in: 9 months with Dr. Eldridge DaceVaranasi. You will receive a reminder letter in the mail two months in advance. If you don't receive a letter, please call our office to schedule the follow-up appointment.

## 2014-05-10 ENCOUNTER — Other Ambulatory Visit (INDEPENDENT_AMBULATORY_CARE_PROVIDER_SITE_OTHER): Payer: BLUE CROSS/BLUE SHIELD | Admitting: *Deleted

## 2014-05-10 DIAGNOSIS — E782 Mixed hyperlipidemia: Secondary | ICD-10-CM

## 2014-05-10 LAB — LIPID PANEL
CHOL/HDL RATIO: 2
Cholesterol: 188 mg/dL (ref 0–200)
HDL: 99.5 mg/dL (ref >39.00)
LDL CALC: 73 mg/dL (ref 0–99)
NONHDL: 88.5
Triglycerides: 79 mg/dL (ref 0.0–149.0)
VLDL: 15.8 mg/dL (ref 0.0–40.0)

## 2014-05-10 LAB — HEPATIC FUNCTION PANEL
ALBUMIN: 4.2 g/dL (ref 3.5–5.2)
ALK PHOS: 59 U/L (ref 39–117)
ALT: 14 U/L (ref 0–53)
AST: 24 U/L (ref 0–37)
Bilirubin, Direct: 0.2 mg/dL (ref 0.0–0.3)
Total Bilirubin: 0.9 mg/dL (ref 0.2–1.2)
Total Protein: 7.1 g/dL (ref 6.0–8.3)

## 2014-05-10 NOTE — Addendum Note (Signed)
Addended by: Tonita PhoenixBOWDEN, ROBIN K on: 05/10/2014 07:42 AM   Modules accepted: Orders

## 2014-06-11 ENCOUNTER — Encounter: Payer: BLUE CROSS/BLUE SHIELD | Admitting: Internal Medicine

## 2014-06-18 ENCOUNTER — Ambulatory Visit (INDEPENDENT_AMBULATORY_CARE_PROVIDER_SITE_OTHER): Payer: BLUE CROSS/BLUE SHIELD | Admitting: Internal Medicine

## 2014-06-18 ENCOUNTER — Encounter: Payer: Self-pay | Admitting: Internal Medicine

## 2014-06-18 ENCOUNTER — Other Ambulatory Visit (INDEPENDENT_AMBULATORY_CARE_PROVIDER_SITE_OTHER): Payer: BLUE CROSS/BLUE SHIELD

## 2014-06-18 VITALS — BP 136/66 | HR 76 | Temp 98.5°F | Resp 16 | Ht 67.0 in | Wt 128.1 lb

## 2014-06-18 DIAGNOSIS — Z418 Encounter for other procedures for purposes other than remedying health state: Secondary | ICD-10-CM | POA: Diagnosis not present

## 2014-06-18 DIAGNOSIS — Z0000 Encounter for general adult medical examination without abnormal findings: Secondary | ICD-10-CM

## 2014-06-18 DIAGNOSIS — Z23 Encounter for immunization: Secondary | ICD-10-CM

## 2014-06-18 DIAGNOSIS — E782 Mixed hyperlipidemia: Secondary | ICD-10-CM

## 2014-06-18 DIAGNOSIS — Z299 Encounter for prophylactic measures, unspecified: Secondary | ICD-10-CM

## 2014-06-18 DIAGNOSIS — I10 Essential (primary) hypertension: Secondary | ICD-10-CM

## 2014-06-18 DIAGNOSIS — Z87891 Personal history of nicotine dependence: Secondary | ICD-10-CM

## 2014-06-18 LAB — BASIC METABOLIC PANEL
BUN: 10 mg/dL (ref 6–23)
CALCIUM: 9.7 mg/dL (ref 8.4–10.5)
CO2: 29 meq/L (ref 19–32)
Chloride: 107 mEq/L (ref 96–112)
Creatinine, Ser: 1.09 mg/dL (ref 0.40–1.50)
GFR: 87.53 mL/min (ref >60.00)
GLUCOSE: 88 mg/dL (ref 70–99)
POTASSIUM: 5.1 meq/L (ref 3.5–5.1)
Sodium: 139 mEq/L (ref 135–145)

## 2014-06-18 LAB — LIPID PANEL
Cholesterol: 183 mg/dL (ref 0–200)
HDL: 99 mg/dL (ref >39.00)
LDL Cholesterol: 71 mg/dL (ref 0–99)
NonHDL: 84
TRIGLYCERIDES: 65 mg/dL (ref 0.0–149.0)
Total CHOL/HDL Ratio: 2
VLDL: 13 mg/dL (ref 0.0–40.0)

## 2014-06-18 NOTE — Assessment & Plan Note (Signed)
Still without, and he has no intention of smoking ever again. Reminded about risks of cigarette smoke.

## 2014-06-18 NOTE — Assessment & Plan Note (Signed)
Up to date on colonoscopy (due 2023). Given shingles shot today. Up to date on flu shot and tdap. Will do pneumonia shot at next visit.

## 2014-06-18 NOTE — Progress Notes (Signed)
   Subjective:    Patient ID: Steven Bernard, male    DOB: Nov 17, 1950, 64 y.o.   MRN: [id number]  HPI The patient is a 64 YO man who is coming in for wellness. He is not having any new problems today. He has been to the wound center and they have healed up his wound. Just had his hearing checked about 3 months ago and his vision checked about 1 month ago.   [facility], social history reviewed and updated at today's visit.   Review of Systems  Constitutional: Negative for fever, activity change, appetite change, fatigue and unexpected weight change.  HENT: Negative.   Eyes: Negative.   Respiratory: Negative for cough, chest tightness, shortness of breath and wheezing.   Cardiovascular: Negative for chest pain, palpitations and leg swelling.  Gastrointestinal: Negative for abdominal pain, diarrhea, constipation and abdominal distention.  Musculoskeletal: Negative for myalgias, back pain and arthralgias.  Skin: Negative.   Neurological: Negative for dizziness, weakness, numbness and headaches.  Psychiatric/Behavioral: Negative.       Objective:   Physical Exam  Constitutional: He is oriented to person, place, and time. He appears well-developed and well-nourished. No distress.  HENT:  Head: Normocephalic and atraumatic.  Right Ear: External ear normal.  Left Ear: External ear normal.  Eyes: EOM are normal.  Neck: Normal range of motion.  Cardiovascular: Normal rate and regular rhythm.   Murmur heard. Carotids with bruits or murmurs  Pulmonary/Chest: Effort normal and breath sounds normal. No respiratory distress. He has no wheezes. He has no rales.  Abdominal: Soft. Bowel sounds are normal. He exhibits no distension. There is no tenderness. There is no rebound.  Musculoskeletal: Normal range of motion.  Neurological: He is alert and oriented to person, place, and time. Coordination normal.  Skin: Skin is warm and dry.  Psychiatric: He has a normal mood and affect.   Filed Vitals:   06/18/14 0804  BP: 136/66  Pulse: 76  Temp: 98.5 F (36.9 C)  TempSrc: Oral  Resp: 16  Height: 5\' 7"  (1.702 m)  Weight: 128 lb 1.9 oz (58.115 kg)  SpO2: 96%      Assessment & Plan:  Zostavac given at today's visit.

## 2014-06-18 NOTE — Assessment & Plan Note (Signed)
Check lipid panel, continue on lipitor 80 mg daily.

## 2014-06-18 NOTE — Patient Instructions (Signed)
We have given you the shingles shot today. This will help to prevent you from getting the shingles.   We will check your blood work and then call you back with it so you can fill out the form for work which we have signed and returned to you.  We will see you back in 6-12 months. If you are doing well you can wait 1 year to come back. If you have any problems please feel free to come back sooner.   Health Maintenance A healthy lifestyle and preventative care can promote health and wellness.  Maintain regular health, dental, and eye exams.  Eat a healthy diet. Foods like vegetables, fruits, whole grains, low-fat dairy products, and lean protein foods contain the nutrients you need and are low in calories. Decrease your intake of foods high in solid fats, added sugars, and salt. Get information about a proper diet from your health care provider, if necessary.  Regular physical exercise is one of the most important things you can do for your health. Most adults should get at least 150 minutes of moderate-intensity exercise (any activity that increases your heart rate and causes you to sweat) each week. In addition, most adults need muscle-strengthening exercises on 2 or more days a week.   Maintain a healthy weight. The body mass index (BMI) is a screening tool to identify possible weight problems. It provides an estimate of body fat based on height and weight. Your health care provider can find your BMI and can help you achieve or maintain a healthy weight. For males 20 years and older:  A BMI below 18.5 is considered underweight.  A BMI of 18.5 to 24.9 is normal.  A BMI of 25 to 29.9 is considered overweight.  A BMI of 30 and above is considered obese.  Maintain normal blood lipids and cholesterol by exercising and minimizing your intake of saturated fat. Eat a balanced diet with plenty of fruits and vegetables. Blood tests for lipids and cholesterol should begin at age 65 and be repeated  every 5 years. If your lipid or cholesterol levels are high, you are over age 65, or you are at high risk for heart disease, you may need your cholesterol levels checked more frequently.Ongoing high lipid and cholesterol levels should be treated with medicines if diet and exercise are not working.  If you smoke, find out from your health care provider how to quit. If you do not use tobacco, do not start.  Lung cancer screening is recommended for adults aged 55-80 years who are at high risk for developing lung cancer because of a history of smoking. A yearly low-dose CT scan of the lungs is recommended for people who have at least a 30-pack-year history of smoking and are current smokers or have quit within the past 15 years. A pack year of smoking is smoking an average of 1 pack of cigarettes a day for 1 year (for example, a 30-pack-year history of smoking could mean smoking 1 pack a day for 30 years or 2 packs a day for 15 years). Yearly screening should continue until the smoker has stopped smoking for at least 15 years. Yearly screening should be stopped for people who develop a health problem that would prevent them from having lung cancer treatment.  If you choose to drink alcohol, do not have more than 2 drinks per day. One drink is considered to be 12 oz (360 mL) of beer, 5 oz (150 mL) of wine, or 1.5  oz (45 mL) of liquor.  Avoid the use of street drugs. Do not share needles with anyone. Ask for help if you need support or instructions about stopping the use of drugs.  High blood pressure causes heart disease and increases the risk of stroke. Blood pressure should be checked at least every 1-2 years. Ongoing high blood pressure should be treated with medicines if weight loss and exercise are not effective.  If you are 63-39 years old, ask your health care provider if you should take aspirin to prevent heart disease.  Diabetes screening involves taking a blood sample to check your fasting blood  sugar level. This should be done once every 3 years after age 67 if you are at a normal weight and without risk factors for diabetes. Testing should be considered at a younger age or be carried out more frequently if you are overweight and have at least 1 risk factor for diabetes.  Colorectal cancer can be detected and often prevented. Most routine colorectal cancer screening begins at the age of 64 and continues through age 29. However, your health care provider may recommend screening at an earlier age if you have risk factors for colon cancer. On a yearly basis, your health care provider may provide home test kits to check for hidden blood in the stool. A small camera at the end of a tube may be used to directly examine the colon (sigmoidoscopy or colonoscopy) to detect the earliest forms of colorectal cancer. Talk to your health care provider about this at age 39 when routine screening begins. A direct exam of the colon should be repeated every 5-10 years through age 41, unless early forms of precancerous polyps or small growths are found.  People who are at an increased risk for hepatitis B should be screened for this virus. You are considered at high risk for hepatitis B if:  You were born in a country where hepatitis B occurs often. Talk with your health care provider about which countries are considered high risk.  Your parents were born in a high-risk country and you have not received a shot to protect against hepatitis B (hepatitis B vaccine).  You have HIV or AIDS.  You use needles to inject street drugs.  You live with, or have sex with, someone who has hepatitis B.  You are a man who has sex with other men (MSM).  You get hemodialysis treatment.  You take certain medicines for conditions like cancer, organ transplantation, and autoimmune conditions.  Hepatitis C blood testing is recommended for all people born from [birth year] through 1966 and any individual with known risk factors for  hepatitis C.  Healthy men should no longer receive prostate-specific antigen (PSA) blood tests as part of routine cancer screening. Talk to your health care provider about prostate cancer screening.  Testicular cancer screening is not recommended for adolescents or adult males who have no symptoms. Screening includes self-exam, a health care provider exam, and other screening tests. Consult with your health care provider about any symptoms you have or any concerns you have about testicular cancer.  Practice safe sex. Use condoms and avoid high-risk sexual practices to reduce the spread of sexually transmitted infections (STIs).  You should be screened for STIs, including gonorrhea and chlamydia if:  You are sexually active and are younger than 24 years.  You are older than 24 years, and your health care provider tells you that you are at risk for this type of infection.  Your  sexual activity has changed since you were last screened, and you are at an increased risk for chlamydia or gonorrhea. Ask your health care provider if you are at risk.  If you are at risk of being infected with HIV, it is recommended that you take a prescription medicine daily to prevent HIV infection. This is called pre-exposure prophylaxis (PrEP). You are considered at risk if:  You are a man who has sex with other men (MSM).  You are a heterosexual man who is sexually active with multiple partners.  You take drugs by injection.  You are sexually active with a partner who has HIV.  Talk with your health care provider about whether you are at high risk of being infected with HIV. If you choose to begin PrEP, you should first be tested for HIV. You should then be tested every 3 months for as long as you are taking PrEP.  Use sunscreen. Apply sunscreen liberally and repeatedly throughout the day. You should seek shade when your shadow is shorter than you. Protect yourself by wearing long sleeves, pants, a  wide-brimmed hat, and sunglasses year round whenever you are outdoors.  Tell your health care provider of new moles or changes in moles, especially if there is a change in shape or color. Also, tell your health care provider if a mole is larger than the size of a pencil eraser.  A one-time screening for abdominal aortic aneurysm (AAA) and surgical repair of large AAAs by ultrasound is recommended for men aged 65-75 years who are current or former smokers.  Stay current with your vaccines (immunizations). Document Released: 09/26/2007 Document Revised: 04/04/2013 Document Reviewed: 08/25/2010 [facility] Patient Information 2015 [place], [place]. This information is not intended to replace advice given to you by your health care provider. Make sure you discuss any questions you have with your health care provider.

## 2014-06-18 NOTE — Addendum Note (Signed)
Addended by: Conception ChancyOBERSON, Floria Brandau R on: 06/18/2014 08:37 AM   Modules accepted: Orders

## 2014-06-18 NOTE — Assessment & Plan Note (Signed)
Check BMP today, continue on coreg 3.125 mg BID and lisinopril 10 mg daily and imdur 30 mg daily. BP at goal today.

## 2014-06-18 NOTE — Progress Notes (Signed)
Pre visit review using our clinic review tool, if applicable. No additional management support is needed unless otherwise documented below in the visit note. 

## 2014-10-13 ENCOUNTER — Encounter (HOSPITAL_COMMUNITY): Payer: Self-pay | Admitting: *Deleted

## 2014-10-13 ENCOUNTER — Emergency Department (HOSPITAL_COMMUNITY)
Admission: EM | Admit: 2014-10-13 | Discharge: 2014-10-14 | Disposition: A | Discharge status: Home / Self Care | Attending: Emergency Medicine | Admitting: Emergency Medicine

## 2014-10-13 ENCOUNTER — Emergency Department (HOSPITAL_COMMUNITY)

## 2014-10-13 DIAGNOSIS — Z7982 Long term (current) use of aspirin: Secondary | ICD-10-CM | POA: Diagnosis not present

## 2014-10-13 DIAGNOSIS — Z7902 Long term (current) use of antithrombotics/antiplatelets: Secondary | ICD-10-CM | POA: Insufficient documentation

## 2014-10-13 DIAGNOSIS — R0602 Shortness of breath: Secondary | ICD-10-CM | POA: Diagnosis present

## 2014-10-13 DIAGNOSIS — F419 Anxiety disorder, unspecified: Secondary | ICD-10-CM | POA: Diagnosis not present

## 2014-10-13 DIAGNOSIS — I2510 Atherosclerotic heart disease of native coronary artery without angina pectoris: Secondary | ICD-10-CM | POA: Diagnosis not present

## 2014-10-13 DIAGNOSIS — Z79899 Other long term (current) drug therapy: Secondary | ICD-10-CM | POA: Insufficient documentation

## 2014-10-13 DIAGNOSIS — Z87891 Personal history of nicotine dependence: Secondary | ICD-10-CM | POA: Diagnosis not present

## 2014-10-13 DIAGNOSIS — R0981 Nasal congestion: Secondary | ICD-10-CM | POA: Diagnosis not present

## 2014-10-13 LAB — BASIC METABOLIC PANEL
Anion gap: 12 (ref 5–15)
BUN: 8 mg/dL (ref 6–20)
CO2: 24 mmol/L (ref 22–32)
CREATININE: 1.07 mg/dL (ref 0.61–1.24)
Calcium: 9.8 mg/dL (ref 8.9–10.3)
Chloride: 102 mmol/L (ref 101–111)
GFR calc Af Amer: >60 mL/min (ref >60)
GFR calc non Af Amer: >60 mL/min (ref >60)
Glucose, Bld: 92 mg/dL (ref 65–99)
Potassium: 3.8 mmol/L (ref 3.5–5.1)
SODIUM: 138 mmol/L (ref 135–145)

## 2014-10-13 LAB — CBC WITH DIFFERENTIAL/PLATELET
BASOS PCT: 0 % (ref 0–1)
Basophils Absolute: 0 10*3/uL (ref 0.0–0.1)
EOS PCT: 1 % (ref 0–5)
Eosinophils Absolute: 0 10*3/uL (ref 0.0–0.7)
HCT: 45.5 % (ref 39.0–52.0)
HEMOGLOBIN: 16.3 g/dL (ref 13.0–17.0)
LYMPHS ABS: 3.2 10*3/uL (ref 0.7–4.0)
LYMPHS PCT: 55 % — AB (ref 12–46)
MCH: 34.5 pg — ABNORMAL HIGH (ref 26.0–34.0)
MCHC: 35.8 g/dL (ref 30.0–36.0)
MCV: 96.4 fL (ref 78.0–100.0)
MONO ABS: 0.3 10*3/uL (ref 0.1–1.0)
MONOS PCT: 5 % (ref 3–12)
Neutro Abs: 2.3 10*3/uL (ref 1.7–7.7)
Neutrophils Relative %: 39 % — ABNORMAL LOW (ref 43–77)
PLATELETS: 153 10*3/uL (ref 150–400)
RBC: 4.72 MIL/uL (ref 4.22–5.81)
RDW: 12.2 % (ref 11.5–15.5)
WBC: 5.9 10*3/uL (ref 4.0–10.5)

## 2014-10-13 LAB — BRAIN NATRIURETIC PEPTIDE: B Natriuretic Peptide: 52.4 pg/mL (ref 0.0–100.0)

## 2014-10-13 LAB — TROPONIN I: Troponin I: <0.03 ng/mL (ref <0.031)

## 2014-10-13 MED ORDER — LORAZEPAM 1 MG PO TABS
1.0000 mg | ORAL_TABLET | Freq: Once | ORAL | Status: AC
  Administered 2014-10-13: 1 mg via ORAL
  Filled 2014-10-13: qty 1

## 2014-10-13 MED ORDER — FLUTICASONE PROPIONATE 50 MCG/ACT NA SUSP: NASAL | Status: AC

## 2014-10-13 NOTE — ED Notes (Signed)
The pt c/o not being able to breathe through his nose since last night.  The last time he had this he had a blockage in his heart no chest pain .  He just cannot breathe

## 2014-10-13 NOTE — ED Provider Notes (Signed)
CSN: [id number]     Arrival date & time 10/13/14  2136 History   First MD Initiated Contact with Patient 10/13/14 2206     Chief Complaint  Patient presents with  . Shortness of Breath      HPI  She presents for evaluation stating he cannot breathe through the right side of his nose. He waking this morning with the sensations persisted all day. He tried some menthol vapor nasal medication over-the-counter which has not helped. Presents rather anxious and ultimately states that last time he felt this way he had artifact. He has no trouble breathing. No pain in his chest neck back jaw or face.  Past Medical History  Diagnosis Date  . Coronary atherosclerosis of native coronary artery   . Cardiomyopathy    History reviewed. No pertinent past surgical history. Family History  Problem Relation Age of Onset  . Breast cancer Mother   . Cancer Mother   . Heart attack Father   . Stroke Father    History  Substance Use Topics  . Smoking status: Former Smoker -- 1.00 packs/day for 45 years  . Smokeless tobacco: Not on file  . Alcohol Use: Yes    Review of Systems  Constitutional: Negative for fever, chills, diaphoresis, appetite change and fatigue.  HENT: Negative for mouth sores, sore throat and trouble swallowing.        Nasal congestion  Eyes: Negative for visual disturbance.  Respiratory: Negative for cough, chest tightness, shortness of breath and wheezing.   Cardiovascular: Negative for chest pain.  Gastrointestinal: Negative for nausea, vomiting, abdominal pain, diarrhea and abdominal distention.  Endocrine: Negative for polydipsia, polyphagia and polyuria.  Genitourinary: Negative for dysuria, frequency and hematuria.  Musculoskeletal: Negative for gait problem.  Skin: Negative for color change, pallor and rash.  Neurological: Negative for dizziness, syncope, light-headedness and headaches.  Hematological: Does not bruise/bleed easily.  Psychiatric/Behavioral: Negative for  behavioral problems and confusion.      Allergies  Review of patient's allergies indicates no known allergies.  Home Medications   Prior to Admission medications   Medication Sig Start Date End Date Taking? Authorizing Provider  aspirin EC 81 MG tablet Take 1 tablet (81 mg total) by mouth daily. 01/17/13  Yes Corky CraftsJayadeep S [name], MD  atorvastatin (LIPITOR) 80 MG tablet Take 1 tablet (80 mg total) by mouth daily. 07/31/13  Yes Corky CraftsJayadeep S [name], MD  carvedilol (COREG) 3.125 MG tablet Take 1 tablet (3.125 mg total) by mouth 2 (two) times daily with a meal. 07/31/13  Yes Corky CraftsJayadeep S [name], MD  clopidogrel (PLAVIX) 75 MG tablet Take 1 tablet (75 mg total) by mouth daily. 07/31/13  Yes Corky CraftsJayadeep S [name], MD  isosorbide mononitrate (IMDUR) 30 MG 24 hr tablet Take 1 tablet (30 mg total) by mouth daily. 07/31/13  Yes Corky CraftsJayadeep S [name], MD  ketoconazole (NIZORAL) 2 % cream Apply 1 application topically daily. Patient taking differently: Apply 1 application topically daily as needed for irritation.  10/20/13  Yes Corwin LevinsJames W John, MD  lisinopril (PRINIVIL,ZESTRIL) 10 MG tablet Take 1 tablet (10 mg total) by mouth daily. 07/31/13  Yes Corky CraftsJayadeep S [name], MD  Oxymetazoline HCl (NASAL NA) Place 1 each into the nose daily as needed (nasal concentrated gel).   Yes Historical Provider, MD  sodium chloride (EQ SALINE NASAL SPRAY) 0.65 % nasal spray Place 1 spray into the nose as needed for congestion.   Yes Historical Provider, MD  fluticasone (FLONASE) 50 MCG/ACT nasal spray 1 spray each  nares twice a day. 10/13/14   Rolland Porter, MD  nitroGLYCERIN (NITROSTAT) 0.4 MG SL tablet Place 1 tablet (0.4 mg total) under the tongue every 5 (five) minutes as needed for chest pain. 07/31/13   Corky Crafts, MD   BP 164/87 mmHg  Pulse 82  Temp(Src) 98 F (36.7 C) (Oral)  Resp 12  Ht 5\' 6"  (1.676 m)  Wt 140 lb (63.504 kg)  BMI 22.61 kg/m2  SpO2 99% Physical Exam  Constitutional: He is oriented to person, place,  and time. He appears well-developed and well-nourished. No distress.  HENT:  Head: Normocephalic.  No congestion of the right nares. Nontender over the sinuses. Posterior pharynx benign.  Eyes: Conjunctivae are normal. Pupils are equal, round, and reactive to light. No scleral icterus.  Neck: Normal range of motion. Neck supple. No thyromegaly present.  Cardiovascular: Normal rate and regular rhythm.  Exam reveals no gallop and no friction rub.   No murmur heard. Pulmonary/Chest: Effort normal and breath sounds normal. No respiratory distress. He has no wheezes. He has no rales.  Abdominal: Soft. Bowel sounds are normal. He exhibits no distension. There is no tenderness. There is no rebound.  Musculoskeletal: Normal range of motion.  Neurological: He is alert and oriented to person, place, and time.  Skin: Skin is warm and dry. No rash noted.  Psychiatric: He has a normal mood and affect. His behavior is normal.    ED Course  Procedures (including critical care time) Labs Review Labs Reviewed  CBC WITH DIFFERENTIAL/PLATELET - Abnormal; Notable for the following:    MCH 34.5 (*)    Neutrophils Relative % 39 (*)    Lymphocytes Relative 55 (*)    All other components within normal limits  BASIC METABOLIC PANEL  BRAIN NATRIURETIC PEPTIDE  TROPONIN I    Imaging Review Dg Chest 2 View  10/13/2014   CLINICAL DATA:  Acute onset of shortness of breath. Initial encounter.  EXAM: CHEST  2 VIEW  COMPARISON:  Chest radiograph performed 01/13/2011, and CTA of the chest performed 04/07/2006  FINDINGS: The lungs are well-aerated and clear. There is no evidence of focal opacification, pleural effusion or pneumothorax.  The heart is normal in size; the mediastinal contour is within normal limits. No acute osseous abnormalities are seen.  IMPRESSION: No acute cardiopulmonary process seen.   Electronically Signed   By: Roanna Raider M.D.   On: 10/13/2014 23:22     EKG Interpretation   Date/Time:   Saturday October 13 2014 21:40:38 EDT Ventricular Rate:  81 PR Interval:  164 QRS Duration: 84 QT Interval:  364 QTC Calculation: 422 R Axis:   28 Text Interpretation:  Normal sinus rhythm Anteroseptal infarct , age  undetermined Abnormal ECG Confirmed by Fayrene Fearing  MD, Jammie Troup ([id number]) on 10/13/2014  11:02:38 PM      MDM   Final diagnoses:  Nasal congestion  Anxiety    I was able to review the patient's chart. In 2012, he had an episode of nasal congestion or to an urgent care center. An EKG found Q waves. He was admitted and ruled out and had no enzyme elevations. Echo showed old anterior wall hypokinesis. Follow-up calf a week later did show old LAD lesion with collaterals. Had stent to OM.  Discussed this with him at length. His given a dose of by mouth Ativan is able to calm down. It was able to help him understand that at the time his nasal congestion was not a symptom of  his heart. His EKG shows no changes here in his troponin is normal. He has simple nasal congestion today.    Rolland Porter, MD 10/13/14 2342

## 2014-10-13 NOTE — ED Notes (Signed)
The pt insists that he cannot breathe because his nose is blocked up  Using a nose spray.  Rapid respirations

## 2014-10-13 NOTE — Discharge Instructions (Signed)
Your EKG and blood testing is normal today.  Flonase prescription given. This should slowly increase your ability to breathe through your nose over the next several days.

## 2014-11-29 ENCOUNTER — Encounter: Payer: Self-pay | Admitting: Interventional Cardiology

## 2018-12-10 ENCOUNTER — Other Ambulatory Visit: Payer: Self-pay

## 2018-12-10 ENCOUNTER — Emergency Department (HOSPITAL_COMMUNITY)
Admission: EM | Admit: 2018-12-10 | Discharge: 2018-12-10 | Disposition: A | Discharge status: Home / Self Care | Payer: Medicare Other | Attending: Emergency Medicine | Admitting: Emergency Medicine

## 2018-12-10 ENCOUNTER — Encounter (HOSPITAL_COMMUNITY): Payer: Self-pay

## 2018-12-10 ENCOUNTER — Emergency Department (HOSPITAL_COMMUNITY): Payer: Medicare Other

## 2018-12-10 DIAGNOSIS — M545 Low back pain: Secondary | ICD-10-CM | POA: Diagnosis not present

## 2018-12-10 DIAGNOSIS — M5030 Other cervical disc degeneration, unspecified cervical region: Secondary | ICD-10-CM

## 2018-12-10 DIAGNOSIS — Z7902 Long term (current) use of antithrombotics/antiplatelets: Secondary | ICD-10-CM | POA: Insufficient documentation

## 2018-12-10 DIAGNOSIS — S161XXA Strain of muscle, fascia and tendon at neck level, initial encounter: Secondary | ICD-10-CM | POA: Diagnosis not present

## 2018-12-10 DIAGNOSIS — Y92410 Unspecified street and highway as the place of occurrence of the external cause: Secondary | ICD-10-CM | POA: Insufficient documentation

## 2018-12-10 DIAGNOSIS — Z79899 Other long term (current) drug therapy: Secondary | ICD-10-CM | POA: Diagnosis not present

## 2018-12-10 DIAGNOSIS — I2510 Atherosclerotic heart disease of native coronary artery without angina pectoris: Secondary | ICD-10-CM | POA: Insufficient documentation

## 2018-12-10 DIAGNOSIS — Z7982 Long term (current) use of aspirin: Secondary | ICD-10-CM | POA: Insufficient documentation

## 2018-12-10 DIAGNOSIS — Y9389 Activity, other specified: Secondary | ICD-10-CM | POA: Diagnosis not present

## 2018-12-10 DIAGNOSIS — S199XXA Unspecified injury of neck, initial encounter: Secondary | ICD-10-CM | POA: Diagnosis present

## 2018-12-10 DIAGNOSIS — E782 Mixed hyperlipidemia: Secondary | ICD-10-CM | POA: Insufficient documentation

## 2018-12-10 DIAGNOSIS — F17210 Nicotine dependence, cigarettes, uncomplicated: Secondary | ICD-10-CM | POA: Insufficient documentation

## 2018-12-10 DIAGNOSIS — Y999 Unspecified external cause status: Secondary | ICD-10-CM | POA: Insufficient documentation

## 2018-12-10 DIAGNOSIS — M5090 Cervical disc disorder, unspecified, unspecified cervical region: Secondary | ICD-10-CM | POA: Diagnosis not present

## 2018-12-10 DIAGNOSIS — I10 Essential (primary) hypertension: Secondary | ICD-10-CM | POA: Diagnosis not present

## 2018-12-10 MED ORDER — DICLOFENAC SODIUM 1 % TD GEL: 2.0000 g | Freq: Four times a day (QID) | TRANSDERMAL | 0 refills | Status: AC
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
## 2018-12-10 NOTE — Discharge Instructions (Addendum)
Home to rest. Take Tylenol as needed as directed for pain.  You may also apply Voltaren gel to your neck as prescribed. Apply warm compresses to sore muscles for 20 minutes at a time.  Recheck with your doctor in the next week, return to ER for new or worsening symptoms.
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
## 2020-10-25 ENCOUNTER — Other Ambulatory Visit: Payer: Self-pay

## 2020-10-25 ENCOUNTER — Emergency Department (HOSPITAL_COMMUNITY): Payer: Medicare Other

## 2020-10-25 ENCOUNTER — Emergency Department (HOSPITAL_COMMUNITY)
Admission: EM | Admit: 2020-10-25 | Discharge: 2020-10-25 | Disposition: A | Discharge status: Home / Self Care | Payer: Medicare Other | Attending: Emergency Medicine | Admitting: Emergency Medicine

## 2020-10-25 DIAGNOSIS — Z7982 Long term (current) use of aspirin: Secondary | ICD-10-CM | POA: Insufficient documentation

## 2020-10-25 DIAGNOSIS — Z79899 Other long term (current) drug therapy: Secondary | ICD-10-CM | POA: Diagnosis not present

## 2020-10-25 DIAGNOSIS — Z20822 Contact with and (suspected) exposure to covid-19: Secondary | ICD-10-CM | POA: Insufficient documentation

## 2020-10-25 DIAGNOSIS — I10 Essential (primary) hypertension: Secondary | ICD-10-CM | POA: Diagnosis not present

## 2020-10-25 DIAGNOSIS — R0602 Shortness of breath: Secondary | ICD-10-CM | POA: Insufficient documentation

## 2020-10-25 DIAGNOSIS — Z7902 Long term (current) use of antithrombotics/antiplatelets: Secondary | ICD-10-CM | POA: Insufficient documentation

## 2020-10-25 DIAGNOSIS — F17210 Nicotine dependence, cigarettes, uncomplicated: Secondary | ICD-10-CM | POA: Insufficient documentation

## 2020-10-25 DIAGNOSIS — I2510 Atherosclerotic heart disease of native coronary artery without angina pectoris: Secondary | ICD-10-CM | POA: Insufficient documentation

## 2020-10-25 LAB — CBC WITH DIFFERENTIAL/PLATELET
Abs Immature Granulocytes: 0.02 10*3/uL (ref 0.00–0.07)
Basophils Absolute: 0 10*3/uL (ref 0.0–0.1)
Basophils Relative: 1 %
Eosinophils Absolute: 0.1 10*3/uL (ref 0.0–0.5)
Eosinophils Relative: 1 %
HCT: 44.3 % (ref 39.0–52.0)
Hemoglobin: 15.5 g/dL (ref 13.0–17.0)
Immature Granulocytes: 0 %
Lymphocytes Relative: 45 %
Lymphs Abs: 2.9 10*3/uL (ref 0.7–4.0)
MCH: 35 pg — ABNORMAL HIGH (ref 26.0–34.0)
MCHC: 35 g/dL (ref 30.0–36.0)
MCV: 100 fL (ref 80.0–100.0)
Monocytes Absolute: 0.6 10*3/uL (ref 0.1–1.0)
Monocytes Relative: 9 %
Neutro Abs: 2.9 10*3/uL (ref 1.7–7.7)
Neutrophils Relative %: 44 %
Platelets: 163 10*3/uL (ref 150–400)
RBC: 4.43 MIL/uL (ref 4.22–5.81)
RDW: 12.2 % (ref 11.5–15.5)
WBC: 6.5 10*3/uL (ref 4.0–10.5)
nRBC: 0 % (ref 0.0–0.2)

## 2020-10-25 LAB — BASIC METABOLIC PANEL
Anion gap: 12 (ref 5–15)
BUN: <5 mg/dL — ABNORMAL LOW (ref 8–23)
CO2: 22 mmol/L (ref 22–32)
Calcium: 9.6 mg/dL (ref 8.9–10.3)
Chloride: 102 mmol/L (ref 98–111)
Creatinine, Ser: 1.1 mg/dL (ref 0.61–1.24)
GFR, Estimated: >60 mL/min (ref >60)
Glucose, Bld: 102 mg/dL — ABNORMAL HIGH (ref 70–99)
Potassium: 3.7 mmol/L (ref 3.5–5.1)
Sodium: 136 mmol/L (ref 135–145)

## 2020-10-25 LAB — RESP PANEL BY RT-PCR (FLU A&B, COVID) ARPGX2
Influenza A by PCR: NEGATIVE
Influenza B by PCR: NEGATIVE
SARS Coronavirus 2 by RT PCR: NEGATIVE

## 2020-10-25 LAB — D-DIMER, QUANTITATIVE: D-Dimer, Quant: 1.84 ug/mL-FEU — ABNORMAL HIGH (ref 0.00–0.50)

## 2020-10-25 MED ORDER — DOXYCYCLINE HYCLATE 100 MG PO CAPS: 100.0000 mg | ORAL_CAPSULE | Freq: Two times a day (BID) | ORAL | 0 refills | Status: AC

## 2020-10-25 MED ORDER — IOHEXOL 350 MG/ML SOLN
50.0000 mL | Freq: Once | INTRAVENOUS | Status: AC | PRN
  Administered 2020-10-25: 50 mL via INTRAVENOUS

## 2020-10-25 NOTE — ED Notes (Signed)
Wife robin [phone] would like an update

## 2020-10-25 NOTE — Discharge Instructions (Addendum)
You have been seen and discharged from the emergency department.  Your CAT scan showed a right middle and upper lobe pneumonia with normal blood flow.  Take antibiotic as directed.  It is recommended that you have repeat imaging in 3 to 4 weeks to ensure resolution of this pneumonia and to reevaluate that lung area for any other abnormalities that may have been hidden by the pneumonia. The CT scan also showed widening of the thoracic ascending aortic, borderline aneurysm. It is recommended that you have annual imaging to monitor this. Follow-up with your primary provider for reevaluation and further CT scan ordering and care. Take home medications as prescribed. If you have any worsening symptoms, severe chest pain, difficulty breathing or further concerns for your health please return to an emergency department for further evaluation.

## 2020-10-25 NOTE — ED Notes (Signed)
Provider at bedside

## 2020-10-25 NOTE — ED Notes (Signed)
Not in room at this time 

## 2020-10-25 NOTE — ED Provider Notes (Signed)
Emergency Medicine Provider Triage Evaluation Note  Steven Bernard , a 70 y.o. male  was evaluated in triage.  Pt complains of sob.  Review of Systems  Positive: sob Negative: Cp, wheeze, fever, cough  Physical Exam  BP 139/86 (BP Location: Left Arm)   Pulse 86   Temp 98.8 F (37.1 C) (Oral)   Resp (!) 24   SpO2 94%  Gen:   Awake, no distress   Resp:  Normal effort  MSK:   Moves extremities without difficulty  Other:  Lungs CTAB  Medical Decision Making  Medically screening exam initiated at 3:15 AM.  Appropriate orders placed.  Omer Jack was informed that the remainder of the evaluation will be completed by another provider, this initial triage assessment does not replace that evaluation, and the importance of remaining in the ED until their evaluation is complete.  Pt awoke this AM feeling as if he can't catch his breath.  It lasted for a min and resolved.  Felt like a panic attack.  Felt fine at this moment.  No pain.    Fayrene Helper, PA-C 10/25/20 0923    Zadie Rhine, MD 10/25/20 [phone]

## 2020-10-25 NOTE — ED Notes (Signed)
Patient transported to CT 

## 2020-10-25 NOTE — ED Provider Notes (Signed)
[facility] EMERGENCY DEPARTMENT Provider Note   CSN: [id number] Arrival date & time: 10/25/20  0254     History Chief Complaint  Patient presents with   Shortness of Breath    Steven Bernard is a 70 y.o. male.  The history is provided by the patient and the spouse.  Shortness of Breath Severity:  Moderate Onset quality:  Sudden Timing:  Constant Progression:  Resolved Chronicity:  New Relieved by:  None tried Worsened by:  Nothing Associated symptoms: no abdominal pain, no chest pain, no cough, no fever, no hemoptysis, no sore throat and no vomiting   Risk factors: tobacco use   Patient with history of CAD presents with shortness of breath.  He reports that he went to bed feeling well at his baseline.  He reports waking up feeling short of breath and thought he was having a panic attack.  He is now back to baseline.  No fever/cough/sore throat.  No vomiting.  No chest pain.  Denies known history of VTE. Patient is a current smoker    Past Medical History:  Diagnosis Date   Cardiomyopathy [facility])    Coronary atherosclerosis of native coronary artery     Patient Active Problem List   Diagnosis Date Noted   Routine general medical examination at a health care facility 06/18/2014   HTN (hypertension) 06/19/2013   History of tobacco use 01/18/2013   Mixed hyperlipidemia 01/18/2013   Coronary atherosclerosis of native coronary artery    Cardiomyopathy (HCC)     No past surgical history on file.     Family History  Problem Relation Age of Onset   Breast cancer Mother    Cancer Mother    Heart attack Father    Stroke Father     Social History   Tobacco Use   Smoking status: Every Day    Packs/day: 0.50    Years: 45.00    Pack years: 22.50    Types: Cigarettes  Substance Use Topics   Alcohol use: Yes   Drug use: No    Home Medications Prior to Admission medications   Medication Sig Start Date End Date Taking? Authorizing Provider   aspirin EC 81 MG tablet Take 1 tablet (81 mg total) by mouth daily. 01/17/13   Corky Crafts, MD  atorvastatin (LIPITOR) 80 MG tablet Take 1 tablet (80 mg total) by mouth daily. 07/31/13   Corky Crafts, MD  carvedilol (COREG) 3.125 MG tablet Take 1 tablet (3.125 mg total) by mouth 2 (two) times daily with a meal. 07/31/13   Corky Crafts, MD  clopidogrel (PLAVIX) 75 MG tablet Take 1 tablet (75 mg total) by mouth daily. 07/31/13   Corky Crafts, MD  diclofenac sodium (VOLTAREN) 1 % GEL Apply 2 g topically 4 (four) times daily. 12/10/18   Jeannie Fend, PA-C  fluticasone (FLONASE) 50 MCG/ACT nasal spray 1 spray each nares twice a day. 10/13/14   Rolland Porter, MD  isosorbide mononitrate (IMDUR) 30 MG 24 hr tablet Take 1 tablet (30 mg total) by mouth daily. 07/31/13   Corky Crafts, MD  ketoconazole (NIZORAL) 2 % cream Apply 1 application topically daily. Patient taking differently: Apply 1 application topically daily as needed for irritation.  10/20/13   Corwin Levins, MD  lisinopril (PRINIVIL,ZESTRIL) 10 MG tablet Take 1 tablet (10 mg total) by mouth daily. 07/31/13   Corky Crafts, MD  nitroGLYCERIN (NITROSTAT) 0.4 MG SL tablet Place 1 tablet (0.4  mg total) under the tongue every 5 (five) minutes as needed for chest pain. 07/31/13   Corky Crafts, MD  Oxymetazoline HCl (NASAL NA) Place 1 each into the nose daily as needed (nasal concentrated gel).    [provider]  sodium chloride (EQ SALINE NASAL SPRAY) 0.65 % nasal spray Place 1 spray into the nose as needed for congestion.    [provider]    Allergies    Patient has no known allergies.  Review of Systems   Review of Systems  Constitutional:  Negative for fever.  HENT:  Negative for sore throat.   Respiratory:  Positive for shortness of breath. Negative for cough and hemoptysis.   Cardiovascular:  Negative for chest pain.  Gastrointestinal:  Negative for abdominal pain and  vomiting.  All other systems reviewed and are negative.  Physical Exam Updated Vital Signs BP (!) 126/94   Pulse 92   Temp 99.4 F (37.4 C) (Oral)   Resp 17   Ht 1.702 m (5\' 7" )   Wt 72.6 kg   SpO2 98%   BMI 25.06 kg/m   Physical Exam CONSTITUTIONAL: Well developed/well nourished HEAD: Normocephalic/atraumatic EYES: EOMI/PERRL ENMT: Mucous membranes moist NECK: supple no meningeal signs SPINE/BACK:entire spine nontender CV: S1/S2 noted, no murmurs/rubs/gallops noted LUNGS: Brief crackles noted in right upper lung otherwise no apparent distress ABDOMEN: soft, nontender, no rebound or guarding, bowel sounds noted throughout abdomen GU:no cva tenderness NEURO: Pt is awake/alert/appropriate, moves all extremitiesx4.  No facial droop.   EXTREMITIES: pulses normal/equal, full ROM, no calf tenderness or edema SKIN: warm, color normal PSYCH: no abnormalities of mood noted, alert and oriented to situation  ED Results / Procedures / Treatments   Labs (all labs ordered are listed, but only abnormal results are displayed) Labs Reviewed  BASIC METABOLIC PANEL - Abnormal; Notable for the following components:      Result Value   Glucose, Bld 102 (*)    BUN <5 (*)    All other components within normal limits  CBC WITH DIFFERENTIAL/PLATELET - Abnormal; Notable for the following components:   MCH 35.0 (*)    All other components within normal limits  RESP PANEL BY RT-PCR (FLU A&B, COVID) [id number]  D-DIMER, QUANTITATIVE    EKG EKG Interpretation  Date/Time:  Friday October 25 2020 03:04:48 EDT Ventricular Rate:  86 PR Interval:  168 QRS Duration: 88 QT Interval:  392 QTC Calculation: 469 R Axis:   -19 Text Interpretation: Normal sinus rhythm Anteroseptal infarct , age undetermined Abnormal ECG Interpretation limited secondary to artifact Confirmed by 01-27-1994 (Zadie Rhine) on 10/25/2020 4:41:53 AM  Radiology DG Chest 2 View  Result Date: 10/25/2020 CLINICAL DATA:  70 year old  male with shortness of breath. EXAM: CHEST - 2 VIEW COMPARISON:  Chest radiograph dated 10/13/2014. FINDINGS: There is patchy area of airspace opacity in the right upper and middle lobes most consistent with developing infiltrate. Clinical correlation and follow-up recommended. The left lung is clear. There is no pleural effusion pneumothorax. Mild cardiomegaly. Atherosclerotic calcification of the aorta. No acute osseous pathology. IMPRESSION: Right upper and middle lobe pneumonia. Electronically Signed   By: 12/14/2014 M.D.   On: 10/25/2020 03:36    Procedures Procedures   Medications Ordered in ED Medications - No data to display  ED Course  I have reviewed the triage vital signs and the nursing notes.  Pertinent labs & imaging results that were available during my care of the patient were reviewed by me and  considered in my medical decision making (see chart for details).    MDM Rules/Calculators/A&P                          5:47 AM He reports abrupt onset of shortness of breath that is now improved.  He is in no acute distress and not hypoxic X-ray is consistent with pneumonia, but he has had no recent cough/fever/sore throat.  Will obtain D-dimer and COVID-19 testing 7:38 AM Patient found to have elevated D-dimer.  CT chest ordered.  Signed out to Dr. Hyman Bower at shift change Final Clinical Impression(s) / ED Diagnoses Final diagnoses:  None    Rx / DC Orders ED Discharge Orders     None        Zadie Rhine, MD 10/25/20 [phone]

## 2020-10-25 NOTE — ED Provider Notes (Signed)
Assumed care from previous provider.  Please refer to their note for full HPI.  Briefly this is a 70 year old male who woke up this morning with shortness of breath, currently resolved and symptom-free.  Vitals are stable.  No chest pain.  EKG is similar to previous.  Blood work is reassuring.  Chest x-ray shows a right-sided pneumonia.  Since patient did not have prominent symptoms secondary to this a D-dimer was done which is elevated and we are pending a CT scan to rule out PE. Physical Exam  BP 121/77 (BP Location: Right Arm)   Pulse 83   Temp 98.5 F (36.9 C) (Oral)   Resp 15   Ht 5\' 7"  (1.702 m)   Wt 72.6 kg   SpO2 99%   BMI 25.06 kg/m   Physical Exam Vitals and nursing note reviewed.  Constitutional:      Appearance: Normal appearance.  HENT:     Head: Normocephalic.     Mouth/Throat:     Mouth: Mucous membranes are moist.  Cardiovascular:     Rate and Rhythm: Normal rate.  Pulmonary:     Effort: Pulmonary effort is normal. No tachypnea, accessory muscle usage or respiratory distress.     Breath sounds: Examination of the right-middle field reveals rales. Rales present.  Chest:     Chest wall: No tenderness.  Abdominal:     Palpations: Abdomen is soft.     Tenderness: There is no abdominal tenderness.  Musculoskeletal:     Right lower leg: No edema.     Left lower leg: No edema.  Skin:    General: Skin is warm.  Neurological:     Mental Status: He is alert and oriented to person, place, and time. Mental status is at baseline.  Psychiatric:        Mood and Affect: Mood normal.    ED Course/Procedures     Procedures  MDM   CT PE study identifies no pulmonary embolism.  It does show right middle and upper lobe findings most consistent with pneumonia.  They mention possible pulmonary infarct however the blood flow to that area is normal so this is very unlikely.  He also has swollen lymph nodes consistent with infectious disease.  There is also noted mild 4.2  thoracic ascending aortic aneurysm.  I discussed these results with the patient and his spouse.  They understand the meaning of the finding of this aortic dilation.  They understand that this will require annual repeat scanning for monitoring.  In regards to the right-sided pneumonia, with his atypical presentation and lymphadenopathy it is recommended that he gets a repeat scan in 3 to 4 weeks after he completes his treatment for pneumonia to ensure that there is nothing underlying including malignancy.  These instructions have been put specifically on their discharge papers.  His vitals are normal at my time of reevaluation, he is well-appearing and offers no complaint.  Again no chest pain.  Plan for outpatient follow-up and repeat imaging with strict return to ED precautions.  Patient will be discharged and treated as an outpatient.  Discharge plan and strict return to ED precautions discussed, patient verbalizes understanding and agreement.       , DO 10/25/20 1008

## 2020-10-25 NOTE — ED Triage Notes (Signed)
Pt c/o SHOB x1.5hrs, woke him from sleep. Denies Resp hx, states he felt "wheezy on waking." Pt noted to be wheezing in triage. Tachypneic, slightly increased work of breathing

## 2020-11-01 ENCOUNTER — Other Ambulatory Visit: Payer: Self-pay | Admitting: Nurse Practitioner

## 2020-11-01 DIAGNOSIS — Z87891 Personal history of nicotine dependence: Secondary | ICD-10-CM

## 2020-11-11 DEATH — deceased

## 2021-12-01 IMAGING — CR DG CHEST 2V
2 series · 2 of 2 positions shown · non-contrast
Comparison: Chest radiograph dated 10/13/2014.

CLINICAL DATA: 69-year-old male with shortness of breath.

EXAM:
CHEST - 2 VIEW

[chest pa]
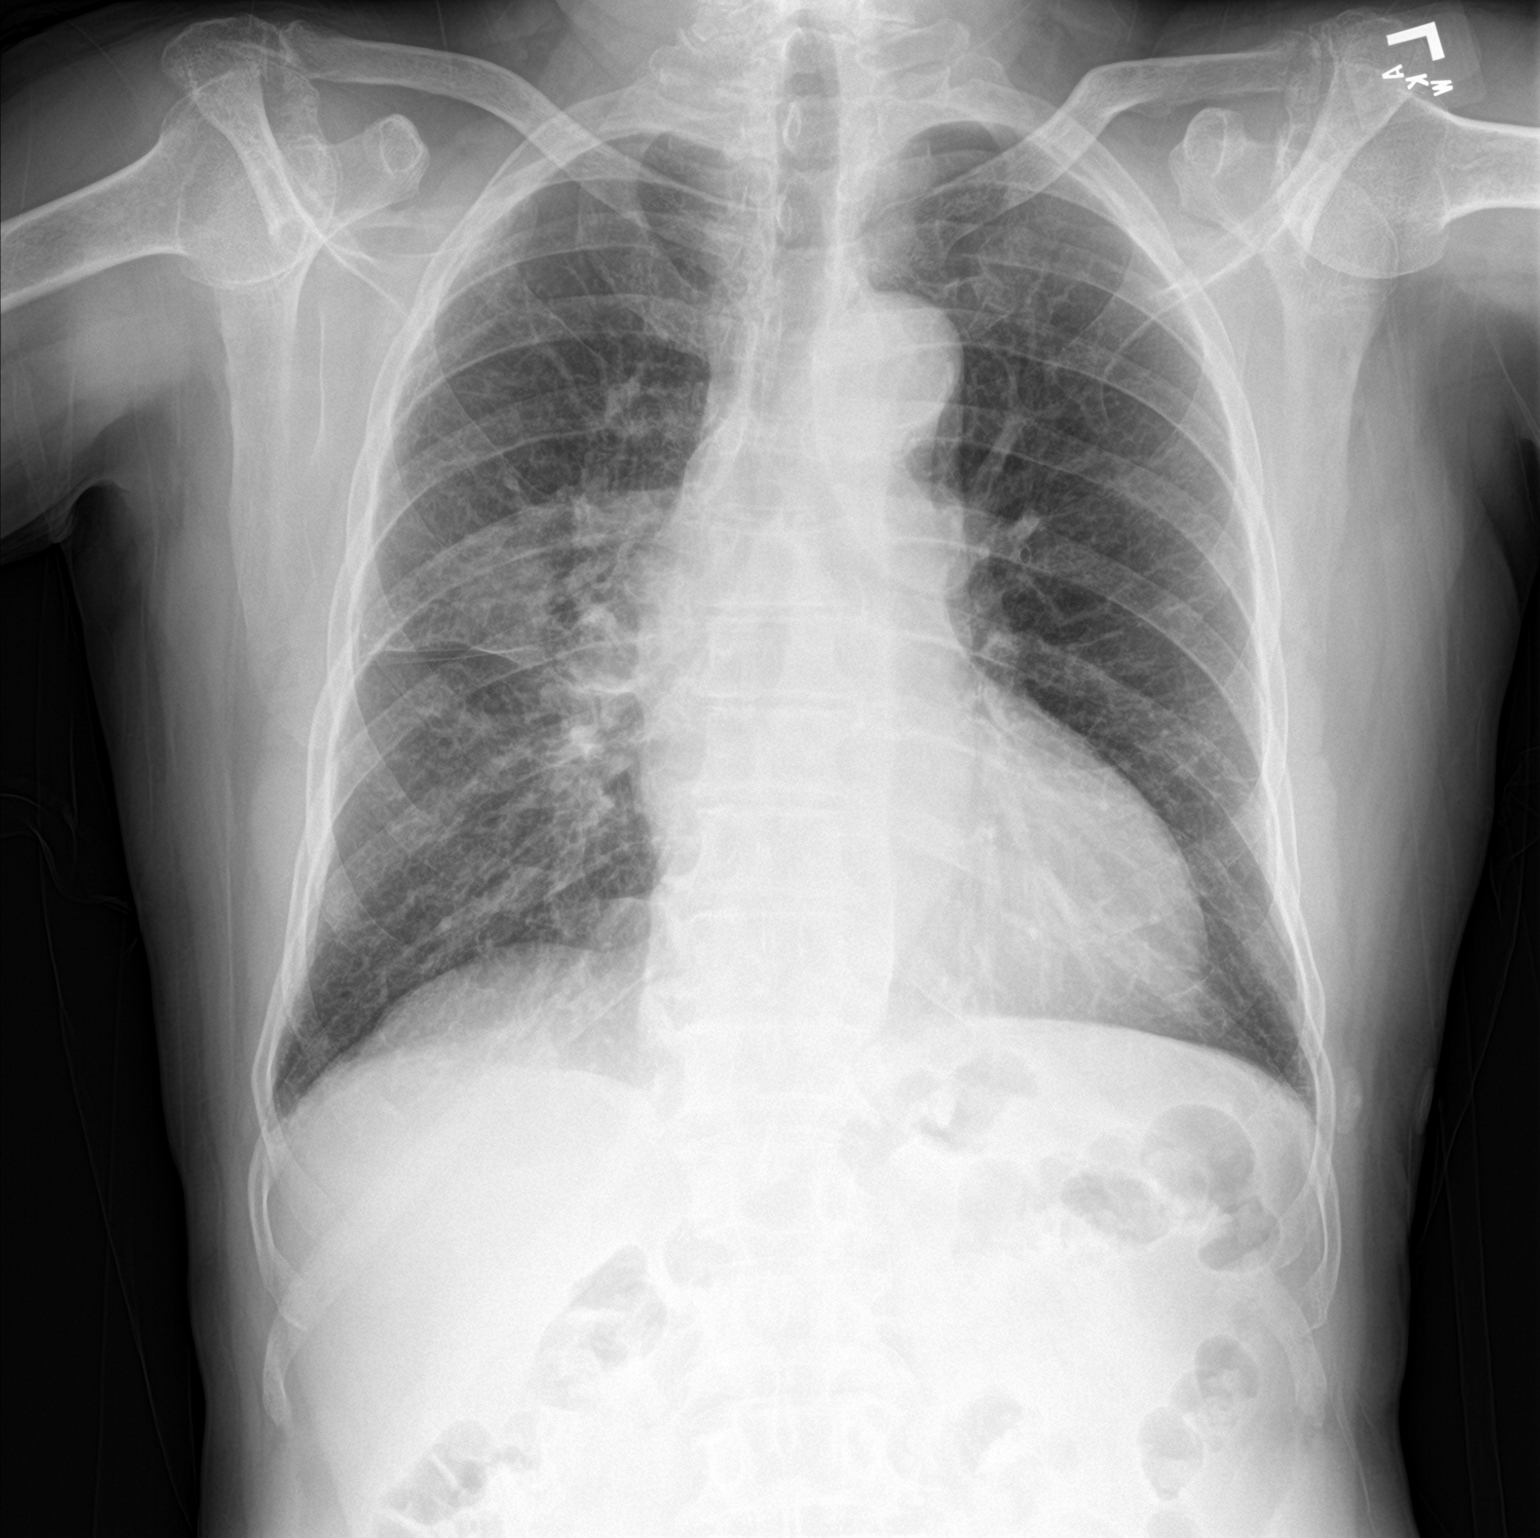

[chest lat]
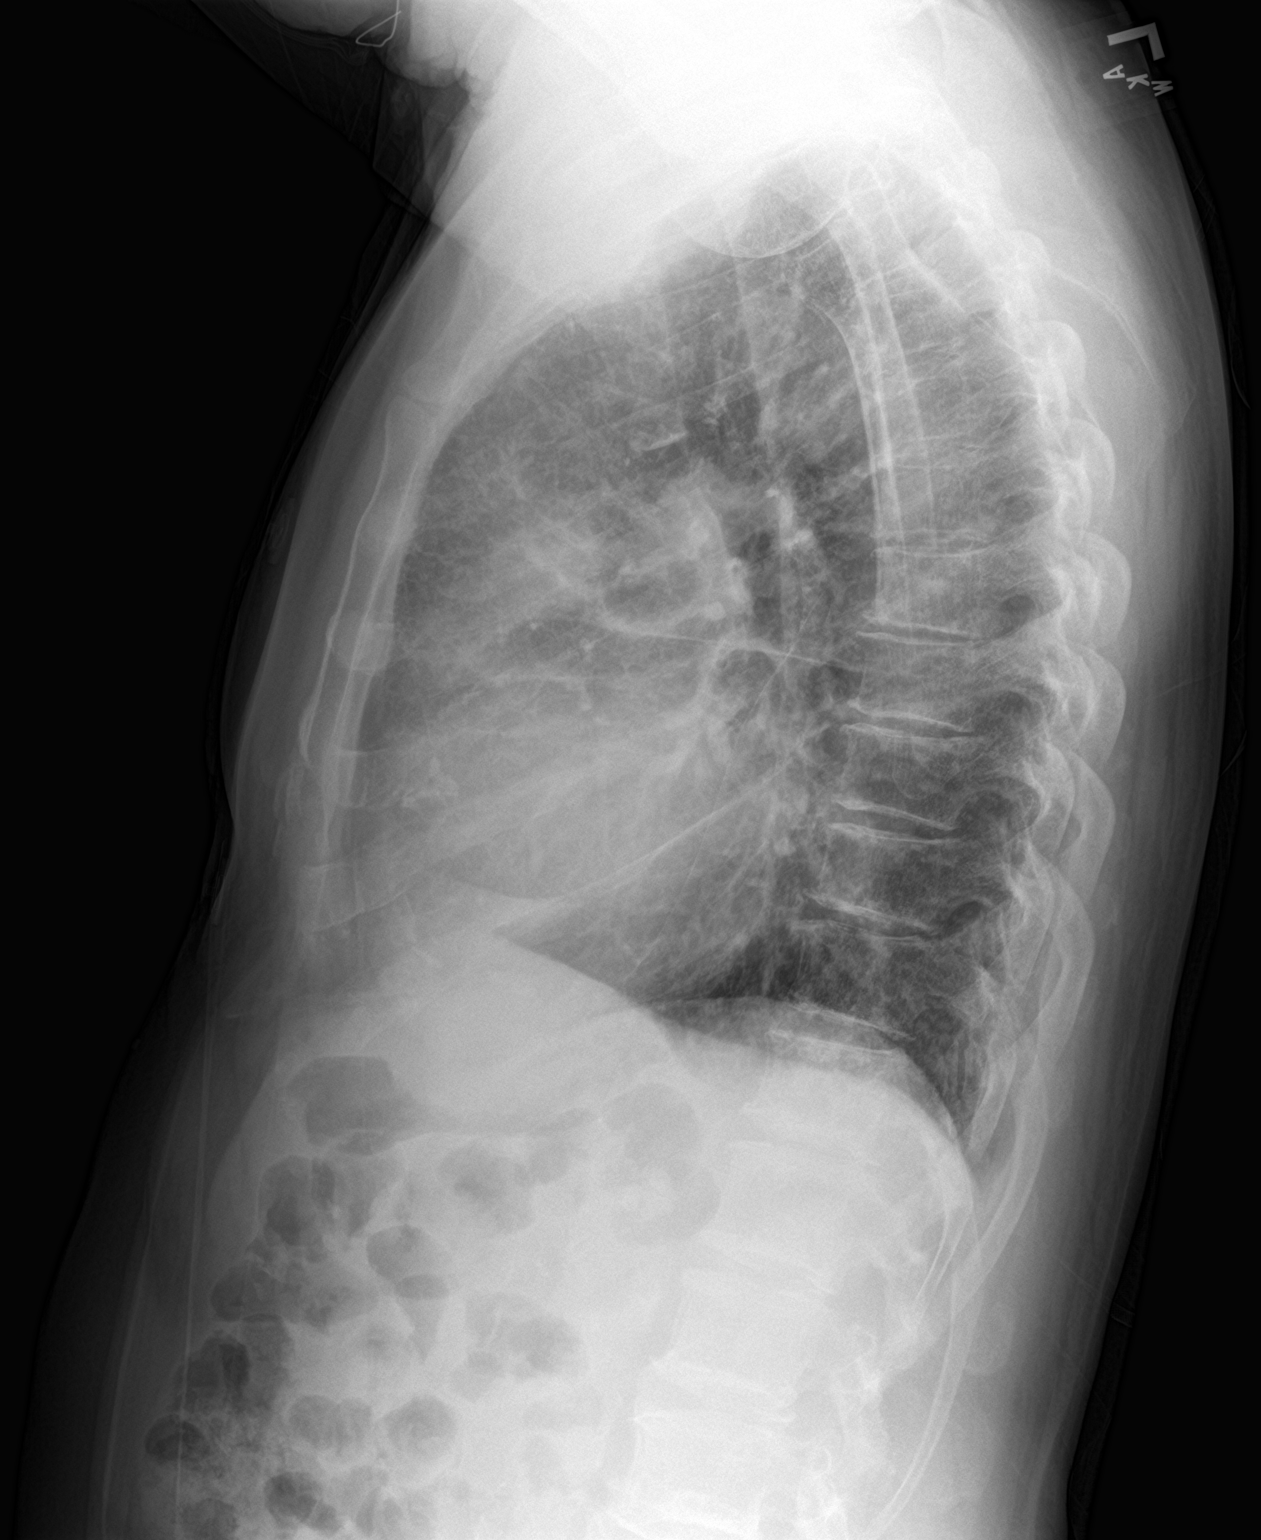

[2 of 2 positions shown; findings below may reference images not displayed]

FINDINGS: There is patchy area of airspace opacity in the right upper and
middle lobes most consistent with developing infiltrate. Clinical
correlation and follow-up recommended. The left lung is clear. There
is no pleural effusion pneumothorax. Mild cardiomegaly.
Atherosclerotic calcification of the aorta. No acute osseous
pathology.
IMPRESSION: Right upper and middle lobe pneumonia.

## 2021-12-01 IMAGING — CT CT ANGIO CHEST
2 of 7 series · 18 of 46 positions shown · IV contrast (APPLIED)
Comparison: 04/07/2006

CLINICAL DATA: Shortness of breath

EXAM:
CT ANGIOGRAPHY CHEST WITH CONTRAST
TECHNIQUE: Multidetector CT imaging of the chest was performed using the
standard protocol during bolus administration of intravenous
contrast. Multiplanar CT image reconstructions and MIPs were
obtained to evaluate the vascular anatomy.
CONTRAST:  50mL OMNIPAQUE IOHEXOL 350 MG/ML SOLN

[Series 7: thins · axial · 0.58mm/px · z∈[+1030,+1293]mm · 15 of 424 slices shown]
[im 24/424  lung]
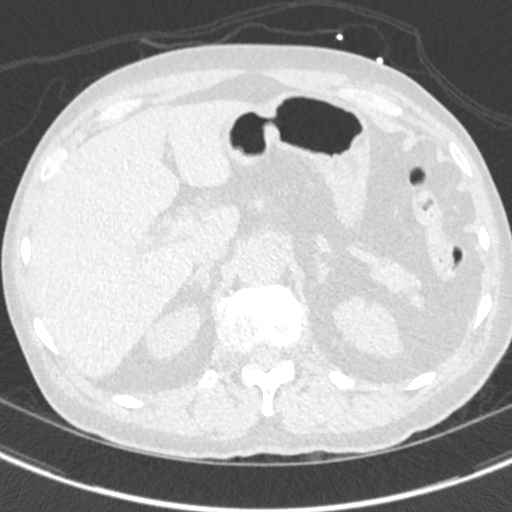
[im 48/424  soft-tissue]
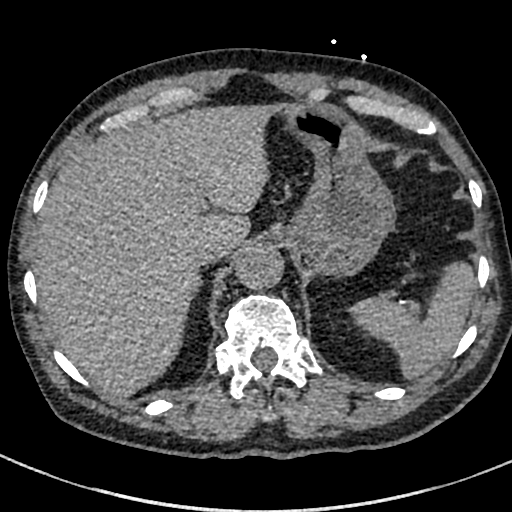
[im 71/424  lung]
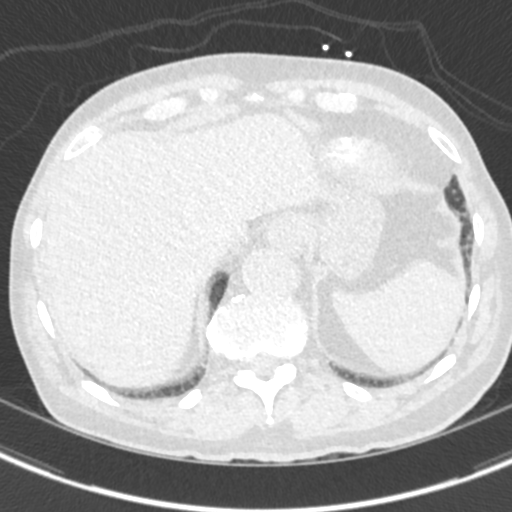
[im 95/424  soft-tissue]
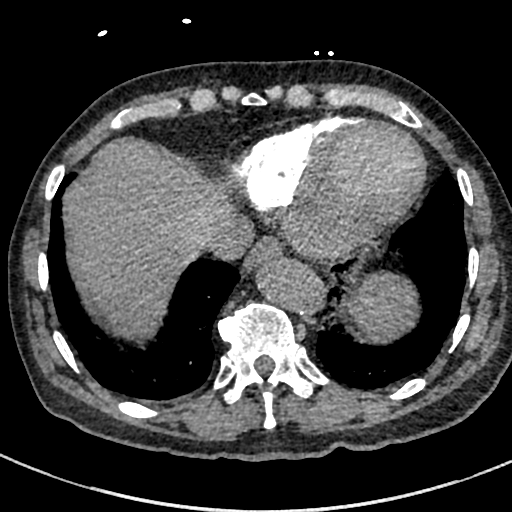
[im 142/424  lung]
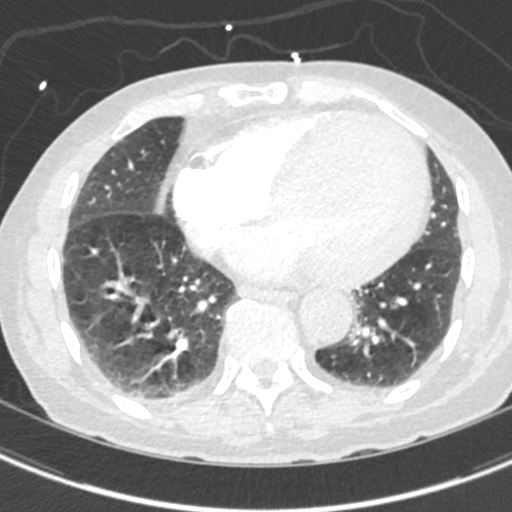
[im 165/424  soft-tissue]
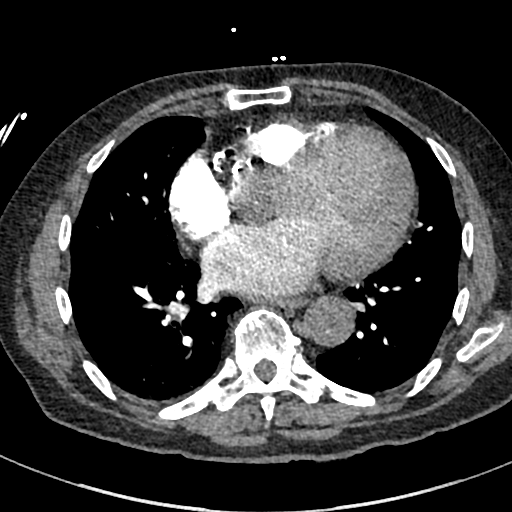
[im 189/424  lung]
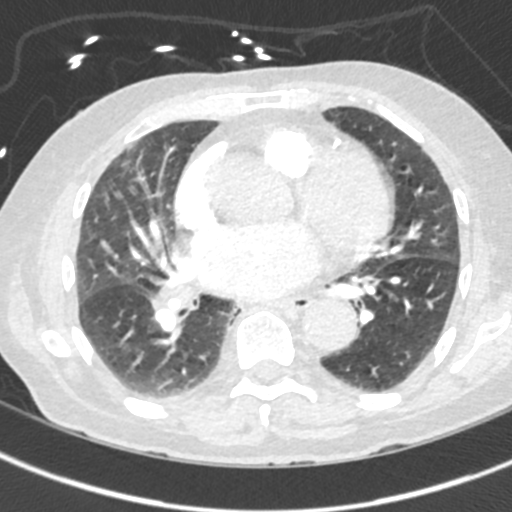
[im 212/424  soft-tissue]
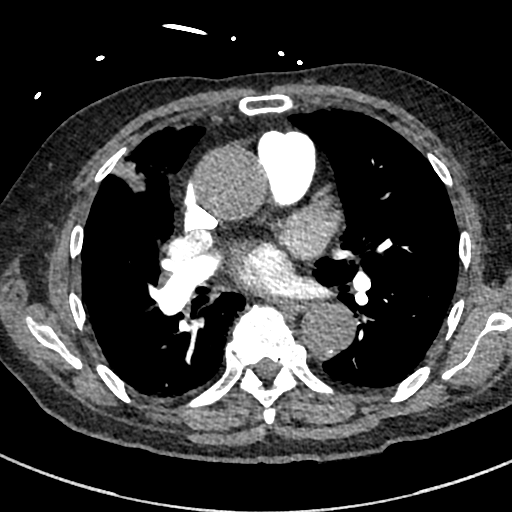
[im 236/424  lung]
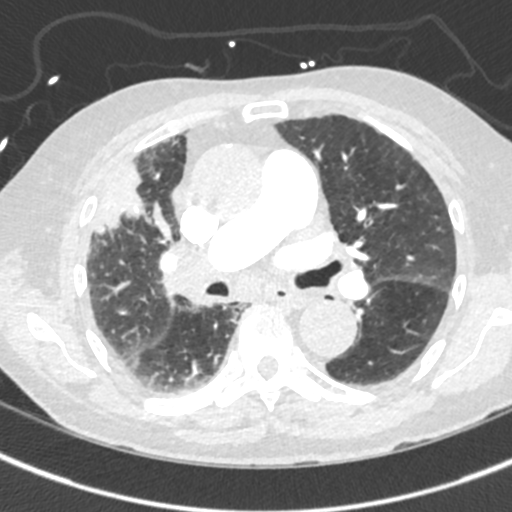
[im 259/424  soft-tissue]
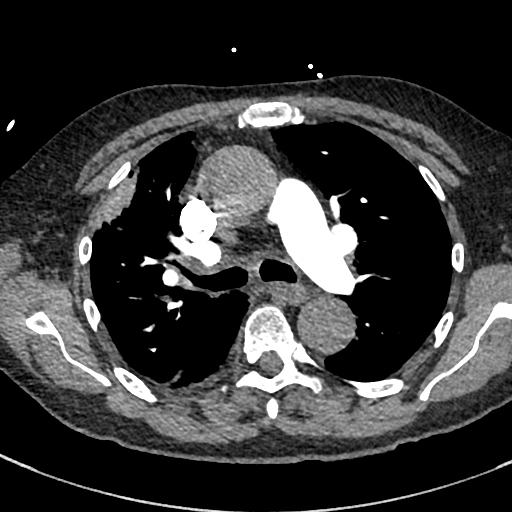
[im 283/424  lung]
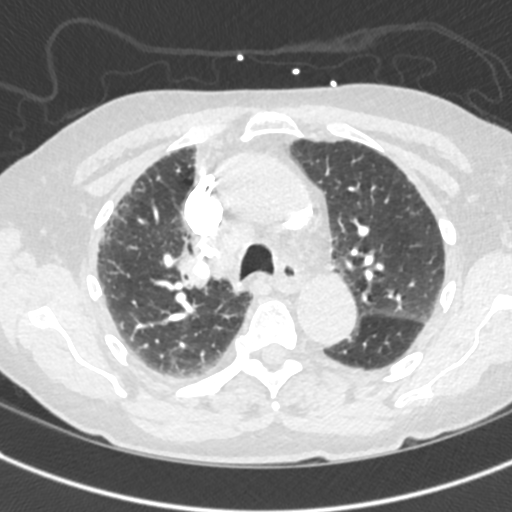
[im 330/424  soft-tissue]
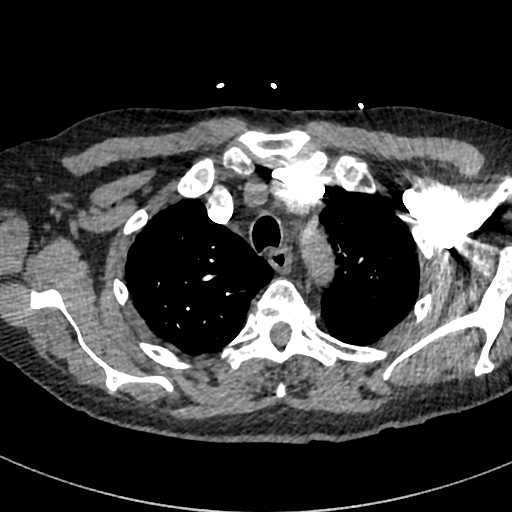
[im 353/424  lung]
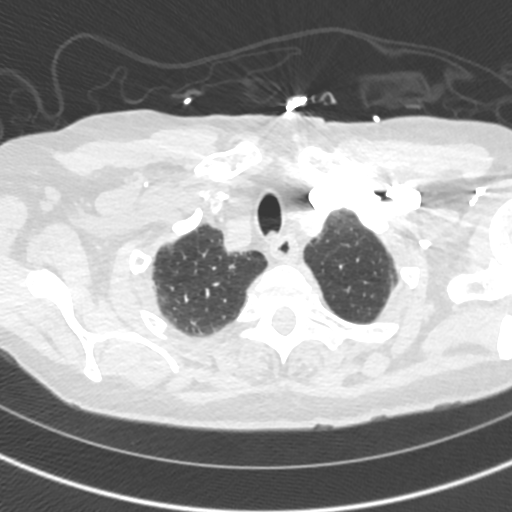
[im 377/424  soft-tissue]
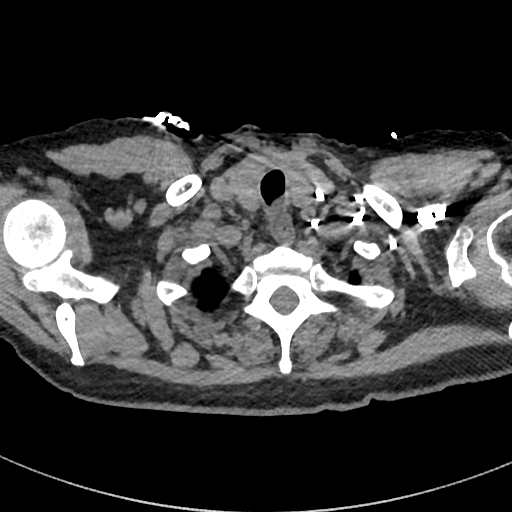
[im 400/424  lung]
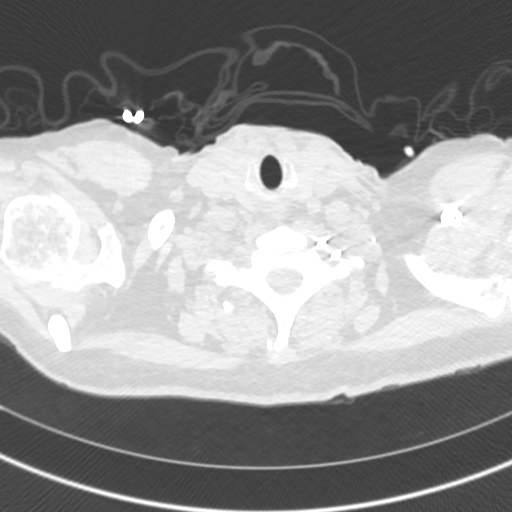

[Series 8: cor · coronal · 0.60mm/px · 3 of 114 slices shown]
[im 29/114  soft-tissue]
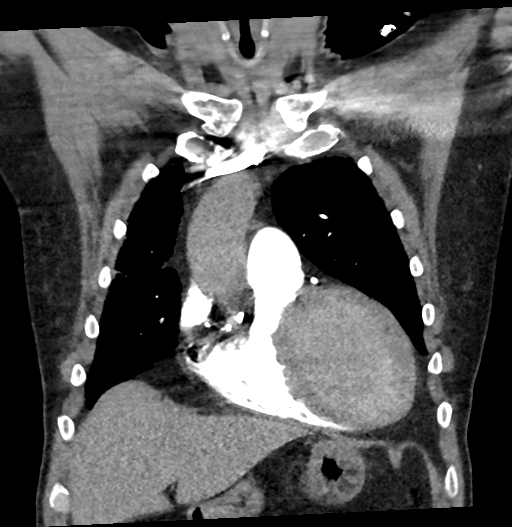
[im 57/114  soft-tissue]
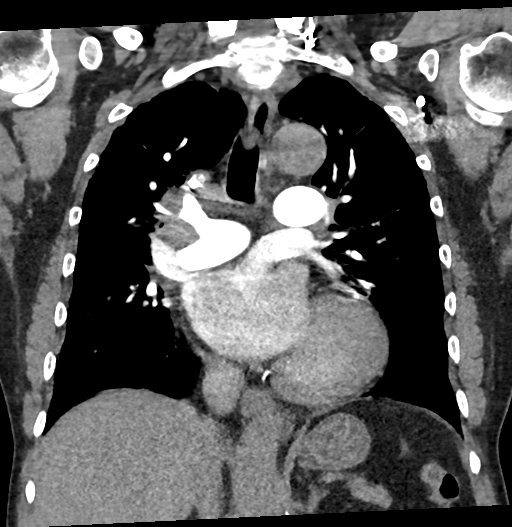
[im 85/114  soft-tissue]
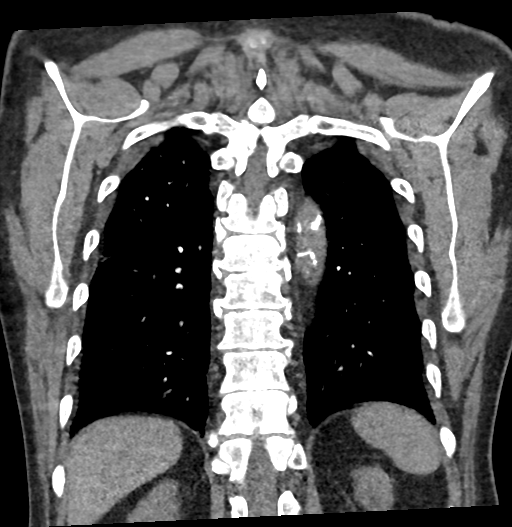

[18 of 46 positions shown; findings below may reference images not displayed]

FINDINGS: Cardiovascular: No filling defects in the pulmonary arteries to
suggest pulmonary emboli. Cardiomegaly. Coronary artery and aortic
calcifications. 4.2 cm ascending thoracic aortic aneurysm.

Mediastinum/Nodes: Enlarged mediastinal lymph nodes. Right
paratracheal lymph node has a short axis diameter of 1.4 cm.
Subcarinal lymph node has a short axis diameter of 15 mm. Right
hilar adenopathy is has a short axis diameter of 1.7 cm.

Lungs/Pleura: Mild emphysema. Peripheral airspace opacity in the
anterior right upper lobe measures 3.3 x 2.1 cm. No confluent
opacities on the left. No effusions.

Upper Abdomen: Imaging into the upper abdomen demonstrates no acute
findings.

Musculoskeletal: Chest wall soft tissues are unremarkable. No acute
bony abnormality.

Review of the MIP images confirms the above findings.
IMPRESSION: No evidence of pulmonary embolus.

Peripheral airspace opacity anteriorly in the right upper lobe.
Appearance is most suggestive of pneumonia or pulmonary infarct.
However, good opacification of the pulmonary arteries noted into
this airspace disease making pulmonary infarct unlikely. And given
the associated right hilar and mediastinal adenopathy, neoplasm
cannot be excluded. Followup study Recommend annual imaging followup
by CTA or MRA.

Diffuse coronary artery disease.  Cardiomegaly.

4.2 cm ascending thoracic aortic aneurysm. This recommendation
follows 8939 ACCF/AHA/AATS/ACR/ASA/SCA/CALLE/VICENTE RAMON/LURA/BILLIOT Guidelines
for the Diagnosis and Management of Patients with Thoracic Aortic
Disease. Circulation. 8939; 121: E266-e369. Aortic aneurysm NOS
(P4EBK-48X.B) is recommended in 3-4 weeks following trial of
antibiotic therapy to ensure resolution and exclude underlying
malignancy.

Aortic Atherosclerosis (P4EBK-7HT.T) and Emphysema (P4EBK-CNP.F).
